# Patient Record
Sex: Female | Born: 1980 | Race: White | Hispanic: No | Marital: Married | Smoking: Never smoker
Health system: Southern US, Community
[De-identification: ages and names within clinical notes are randomized; demographics above are authoritative.]

## PROBLEM LIST (undated history)

## (undated) DIAGNOSIS — R87619 Unspecified abnormal cytological findings in specimens from cervix uteri: Secondary | ICD-10-CM

## (undated) DIAGNOSIS — O24419 Gestational diabetes mellitus in pregnancy, unspecified control: Secondary | ICD-10-CM

## (undated) HISTORY — PX: WISDOM TOOTH EXTRACTION: SHX21

---

## 2011-02-28 ENCOUNTER — Ambulatory Visit: Payer: Self-pay

## 2012-06-08 ENCOUNTER — Inpatient Hospital Stay: Payer: Self-pay | Admitting: Obstetrics and Gynecology

## 2012-06-08 LAB — HEMATOCRIT: HCT: 44.2 % (ref 35.0–47.0)

## 2012-06-09 LAB — HEMATOCRIT: HCT: 38.2 % (ref 35.0–47.0)

## 2012-07-28 ENCOUNTER — Ambulatory Visit: Payer: Self-pay | Admitting: Family Medicine

## 2012-07-28 LAB — GC/CHLAMYDIA PROBE AMP

## 2012-07-28 LAB — WET PREP, GENITAL

## 2014-02-06 DIAGNOSIS — R87619 Unspecified abnormal cytological findings in specimens from cervix uteri: Secondary | ICD-10-CM

## 2014-02-06 HISTORY — DX: Unspecified abnormal cytological findings in specimens from cervix uteri: R87.619

## 2014-03-09 NOTE — L&D Delivery Note (Signed)
Deliver Note   Date of Delivery:   10/11/2014 Primary OB:   [facility] Gestational Age/EDD: [redacted]w[redacted]d by 11/05/2014, by Last Menstrual Period  Antepartum complications:  OB History    Gravida Para Term Preterm AB TAB SAB Ectopic Multiple Living   Delivered By:   Vena Austria MD  Delivery Type:   SVD Anesthesia:     epidural  Intrapartum complications:  GBS:     Unknown declined GBS prophylaxis Laceration:    none Episiotomy:    none Placenta:    Spontaneous Estimated Blood Loss:  Baby:     Liveborn female  APGAR (1 MIN): 8   APGAR (5 MINS): 9   Weight  pending    Deliver Details   A female was delivered via  (Presentation: OA ).  APGAR: 8, 9; weight  .   Placenta status: spontaneous, intact.  Cord:  3VC without complications: Marland Kitchen    Mom to postpartum.  Baby to Couplet care / Skin to Skin.

## 2014-07-17 NOTE — H&P (Signed)
L&D Evaluation:  History:  HPI 34 year old G2 P1001 with EDC=06/10/2012 by LMP=09/03/2012 presents at 6939 5/7 weeks with c/o onset ctxs last night at 0030, intensifying at 1000 this AM. Has had a bloody show. No LOF. Has had a URI x 4 days, but is starting to feel better. No fever. Prenatal care remarkable for a early first trimester ultrasound confirming dates, , an elevated one hr GTT of 190 with  a normal 3hr GTT, a positive HRHPV on Pap smear, and negative GBS culture.   Presents with contractions   Patient's Medical History No Chronic Illness   Patient's Surgical History Wisdom teeth extraction   Medications Pre Natal Vitamins  fish oil capsules, vit D 400 IU, Ambien, Afrin spray.   Allergies NKDA   Social History none   Family History Non-Contributory   ROS:  ROS ROS positive for nasal congestion   Exam:  Vital Signs stable   General breathing thru contractions   Mental Status clear   Chest clear   Heart normal sinus rhythm, no murmur/gallop/rubs   Abdomen gravid, tender with contractions   Estimated Fetal Weight Average for gestational age, 7-10   Fetal Position cephalic   Edema no edema   Reflexes 2+   Pelvic no external lesions, 4/80-90%/-1   Mebranes Intact   FHT normal rate with no decels, 135 with accels to 160s   FHT Description Cat 1   Ucx regular, q2-3 min   Skin dry   Impression:  Impression IUP at 39 5/7 weeks  in active labor   Plan:  Plan EFM/NST, monitor contractions and for cervical change, Epidural if desires   Electronic Signatures: Trinna BalloonGutierrez, Dequon Schnebly L (CNM)  (Signed 02-Apr-14 13:54)  Authored: L&D Evaluation   Last Updated: 02-Apr-14 13:54 by Trinna BalloonGutierrez, Fiana Gladu L (CNM)

## 2014-10-11 ENCOUNTER — Inpatient Hospital Stay
Admission: EM | Admit: 2014-10-11 | Discharge: 2014-10-13 | DRG: 775 | Disposition: A | Discharge status: Home / Self Care | Payer: 59 | Attending: Obstetrics and Gynecology | Admitting: Obstetrics and Gynecology

## 2014-10-11 ENCOUNTER — Inpatient Hospital Stay: Payer: 59 | Admitting: Anesthesiology

## 2014-10-11 ENCOUNTER — Encounter: Payer: Self-pay | Admitting: Advanced Practice Midwife

## 2014-10-11 DIAGNOSIS — O42913 Preterm premature rupture of membranes, unspecified as to length of time between rupture and onset of labor, third trimester: Principal | ICD-10-CM | POA: Diagnosis present

## 2014-10-11 DIAGNOSIS — Z3A36 36 weeks gestation of pregnancy: Secondary | ICD-10-CM | POA: Diagnosis present

## 2014-10-11 DIAGNOSIS — O4290 Premature rupture of membranes, unspecified as to length of time between rupture and onset of labor, unspecified weeks of gestation: Secondary | ICD-10-CM | POA: Diagnosis present

## 2014-10-11 HISTORY — DX: Unspecified abnormal cytological findings in specimens from cervix uteri: R87.619

## 2014-10-11 LAB — ABO/RH: ABO/RH(D): O POS

## 2014-10-11 LAB — CBC
HCT: 40.1 % (ref 35.0–47.0)
HEMOGLOBIN: 13.9 g/dL (ref 12.0–16.0)
MCH: 31.5 pg (ref 26.0–34.0)
MCHC: 34.6 g/dL (ref 32.0–36.0)
MCV: 91.2 fL (ref 80.0–100.0)
Platelets: 179 10*3/uL (ref 150–440)
RBC: 4.4 MIL/uL (ref 3.80–5.20)
RDW: 12.7 % (ref 11.5–14.5)
WBC: 10.3 10*3/uL (ref 3.6–11.0)

## 2014-10-11 LAB — TYPE AND SCREEN
ABO/RH(D): O POS
Antibody Screen: NEGATIVE

## 2014-10-11 MED ORDER — FENTANYL 2.5 MCG/ML W/ROPIVACAINE 0.2% IN NS 100 ML EPIDURAL INFUSION (ARMC-ANES)
EPIDURAL | Status: AC
  Administered 2014-10-11: 11 mL/h via EPIDURAL
  Filled 2014-10-11: qty 100

## 2014-10-11 MED ORDER — ACETAMINOPHEN 325 MG PO TABS: 650.0000 mg | ORAL_TABLET | ORAL | Status: DC | PRN

## 2014-10-11 MED ORDER — LIDOCAINE-EPINEPHRINE (PF) 1.5 %-1:200000 IJ SOLN
INTRAMUSCULAR | Status: DC | PRN
  Administered 2014-10-11: 2 mL via EPIDURAL

## 2014-10-11 MED ORDER — TERBUTALINE SULFATE 1 MG/ML IJ SOLN: 0.2500 mg | Freq: Once | INTRAMUSCULAR | Status: AC | PRN

## 2014-10-11 MED ORDER — EPHEDRINE 5 MG/ML INJ
10.0000 mg | INTRAVENOUS | Status: DC | PRN
  Filled 2014-10-11: qty 2

## 2014-10-11 MED ORDER — FENTANYL 2.5 MCG/ML W/ROPIVACAINE 0.2% IN NS 100 ML EPIDURAL INFUSION (ARMC-ANES): 11.0000 mL/h | EPIDURAL | Status: DC

## 2014-10-11 MED ORDER — HYDROCODONE-ACETAMINOPHEN 5-325 MG PO TABS: 1.0000 | ORAL_TABLET | ORAL | Status: DC | PRN

## 2014-10-11 MED ORDER — BUPIVACAINE HCL (PF) 0.25 % IJ SOLN
INTRAMUSCULAR | Status: DC | PRN
  Administered 2014-10-11: 5 mL via PERINEURAL

## 2014-10-11 MED ORDER — OXYTOCIN BOLUS FROM INFUSION: 500.0000 mL | INTRAVENOUS | Status: DC

## 2014-10-11 MED ORDER — OXYTOCIN 40 UNITS IN LACTATED RINGERS INFUSION - SIMPLE MED
1.0000 m[IU]/min | INTRAVENOUS | Status: DC
  Administered 2014-10-11: 1 m[IU]/min via INTRAVENOUS

## 2014-10-11 MED ORDER — PHENYLEPHRINE 40 MCG/ML (10ML) SYRINGE FOR IV PUSH (FOR BLOOD PRESSURE SUPPORT)
80.0000 ug | PREFILLED_SYRINGE | INTRAVENOUS | Status: DC | PRN
Start: 2014-10-11 — End: 2014-10-12
  Filled 2014-10-11: qty 2

## 2014-10-11 MED ORDER — LACTATED RINGERS IV SOLN
INTRAVENOUS | Status: DC
  Administered 2014-10-11: 12:00:00 via INTRAVENOUS

## 2014-10-11 MED ORDER — DIPHENHYDRAMINE HCL 50 MG/ML IJ SOLN: 12.5000 mg | INTRAMUSCULAR | Status: DC | PRN

## 2014-10-11 MED ORDER — BUTORPHANOL TARTRATE 1 MG/ML IJ SOLN: 2.0000 mg | INTRAMUSCULAR | Status: DC | PRN

## 2014-10-11 MED ORDER — OXYTOCIN 40 UNITS IN LACTATED RINGERS INFUSION - SIMPLE MED: 62.5000 mL/h | INTRAVENOUS | Status: DC

## 2014-10-11 MED ORDER — OXYTOCIN 40 UNITS IN LACTATED RINGERS INFUSION - SIMPLE MED
INTRAVENOUS | Status: AC
  Administered 2014-10-11: 1 m[IU]/min via INTRAVENOUS
  Filled 2014-10-11: qty 1000

## 2014-10-11 MED ORDER — LACTATED RINGERS IV SOLN: 500.0000 mL | INTRAVENOUS | Status: DC | PRN

## 2014-10-11 NOTE — Progress Notes (Signed)
Subjective:  Increasing intensity of contractions, still leaking clear fluid  Objective:   Vitals: Blood pressure 111/76, pulse 74, temperature 98 F (36.7 C), temperature source Oral, resp. rate 18, last menstrual period 01/29/2014. General:  Abdomen: Cervical Exam:  Dilation: 1.5 Effacement (%): 80 Station: -2 Presentation: Vertex Exam by:: Tammy Brothers   FHT: 135, mod, +accels, no decels Toco: q3-91min  Results for orders placed or performed during the hospital encounter of 10/11/14 (from the past 24 hour(s))  CBC     Status: None   Collection Time: 10/11/14 12:12 PM  Result Value Ref Range   WBC 10.3 3.6 - 11.0 K/uL   RBC 4.40 3.80 - 5.20 MIL/uL   Hemoglobin 13.9 12.0 - 16.0 g/dL   HCT 95.6 21.3 - 08.6 %   MCV 91.2 80.0 - 100.0 fL   MCH 31.5 26.0 - 34.0 pg   MCHC 34.6 32.0 - 36.0 g/dL   RDW 57.8 46.9 - 62.9 %   Platelets 179 150 - 440 K/uL  Type and screen     Status: None   Collection Time: 10/11/14 12:17 PM  Result Value Ref Range   ABO/RH(D) O POS    Antibody Screen NEG    Sample Expiration 10/14/2014     Assessment:   34 y.o. G3P2002 [redacted]w[redacted]d PROM, undergoing pitocin augmentation  Plan:   1) Labor - continue to titrate up on pitocin  2) Fetus - cat I tracing

## 2014-10-11 NOTE — Progress Notes (Addendum)
S: Pt has been ambulating in hallways off and on, no significant change in contractions  O: Cervix 1/80/-2, VSS, category 1 tracing, irregular ctx per toco  A: IUP at [redacted]w[redacted]d, PPROM  P: Mutual decision made to start Pitocin in about 1 hour. Reviewed risks of tachysystole or FITL with Pitocin. Pt desires to ambulate one more time.   GBS unknown: After discussion with transition nurse, pt opts to decline antibiotics (reports Family Hx of severe allergies). Nursery has discussed monitoring and implications for baby if GBS untreated.   Pain management: discussed pros and cons and side effects of IV stadol and epidural.   Addendum Will see if abel to send rapid GBS via PCR given patient has elected to against 2010 CDC GBS ppx guidlines

## 2014-10-11 NOTE — Anesthesia Procedure Notes (Signed)
Epidural Patient location during procedure: OB Start time: 10/11/2014 7:43 PM End time: 10/11/2014 8:07 PM  Staffing Anesthesiologist: Elijio Miles F Performed by: anesthesiologist   Preanesthetic Checklist Completed: patient identified, site marked, surgical consent, pre-op evaluation, timeout performed, IV checked, risks and benefits discussed and monitors and equipment checked  Epidural Patient position: sitting Prep: Betadine Patient monitoring: heart rate and continuous pulse ox Approach: midline Location: L3-L4 Injection technique: LOR air and hanging drop  Needle:  Needle type: Tuohy  Needle gauge: 18 G Needle length: 9 cm Needle insertion depth: 3.5 cm Catheter size: 20 Guage Catheter at skin depth: 8.5 cm Test dose: negative and 2% lidocaine with Epi 1:200 K  Assessment Sensory level: T8

## 2014-10-11 NOTE — Anesthesia Preprocedure Evaluation (Signed)
Anesthesia Evaluation  Patient identified by MRN, date of birth, ID band Patient awake    Reviewed: Allergy & Precautions, NPO status , Patient's Chart, lab work & pertinent test results  Airway Mallampati: II       Dental   Pulmonary neg pulmonary ROS,    Pulmonary exam normal       Cardiovascular negative cardio ROS Normal cardiovascular exam    Neuro/Psych negative neurological ROS     GI/Hepatic negative GI ROS, Neg liver ROS,   Endo/Other  negative endocrine ROS  Renal/GU negative Renal ROS     Musculoskeletal negative musculoskeletal ROS (+)   Abdominal Normal abdominal exam  (+)   Peds negative pediatric ROS (+)  Hematology negative hematology ROS (+)   Anesthesia Other Findings   Reproductive/Obstetrics negative OB ROS                             Anesthesia Physical Anesthesia Plan  ASA: II  Anesthesia Plan: Epidural   Post-op Pain Management:    Induction:   Airway Management Planned:   Additional Equipment:   Intra-op Plan:   Post-operative Plan:   Informed Consent: I have reviewed the patients History and Physical, chart, labs and discussed the procedure including the risks, benefits and alternatives for the proposed anesthesia with the patient or authorized representative who has indicated his/her understanding and acceptance.     Plan Discussed with:   Anesthesia Plan Comments:         Anesthesia Quick Evaluation

## 2014-10-11 NOTE — H&P (Signed)
Obstetric History and Physical  Chelsea Sherman is a 34 y.o. G3P1000 with Estimated Date of Delivery:11/05/14 per LMP at 7 wk [place] who presents at [redacted]w[redacted]d presenting for SROM at 0545 this am with several gushes of fluid since then. Patient states she has been having mild contractions, pink vaginal bleeding, , with active fetal movement.    Prenatal Course Source of Care: [facility]  with onset of care at 7 weeks Pregnancy complications or risks: none, early entry to care   She plans to breastfeed  Prenatal labs and studies: ABO, Rh: O+  Antibody: negative Rubella: Immune Varicella: Immune RPR:  Non-reactive HBsAg:  Neg HIV: Negative GC/CT: Neg/neg GBS: unknown 1 hr Glucola: 106   Genetic screening: negative 1st trimester screen TDAP: declined    Prenatal Transfer Tool   Past Medical History  Diagnosis Date  . Pap smear abnormality of cervix 02/2014    ASCUS    Past Surgical History  Procedure Laterality Date  . Wisdom tooth extraction      OB History  Gravida Para Term Preterm AB SAB TAB Ectopic Multiple Living  # Outcome Date GA Lbr Len/2nd Weight Sex Delivery Anes PTL Lv  3 Current           2 Term 06/08/12 [redacted]w[redacted]d    Vaginal - spontaneous     1 Gravida 02/07/10 [redacted]w[redacted]d    Vag-Spont         History   Social History  . Marital Status: Married    Spouse Name: N/A  . Number of Children: N/A  . Years of Education: N/A   Social History Main Topics  . Smoking status: Never Smoker   . Smokeless tobacco: Not on file  . Alcohol Use: No  . Drug Use: No  . Sexual Activity: Yes   Other Topics Concern  . None   Social History Narrative  . None    History reviewed. No pertinent family history.  Prescriptions prior to admission  Medication Sig Dispense Refill Last Dose  . cholecalciferol (VITAMIN D) 1000 UNITS tablet Take 1,000 Units by mouth daily.     . Lactobacillus (PROBIOTIC ACIDOPHILUS PO) Take 1 tablet by mouth daily.     . Omega-3 Fatty  Acids (FISH OIL BURP-LESS PO) Take by mouth.     . Prenatal Vit-Fe Fumarate-FA (MULTIVITAMIN-PRENATAL) 27-0.8 MG TABS tablet Take 1 tablet by mouth daily at 12 noon.       No Known Allergies  Review of Systems: Negative except for what is mentioned in HPI.  Physical Exam: LMP 01/29/2014 GENERAL: Well-developed, well-nourished female in no acute distress.  LUNGS: Clear to auscultation bilaterally.  HEART: Regular rate and rhythm. ABDOMEN: Soft, nontender, nondistended, gravid. EXTREMITIES: Nontender, no edema Cervical Exam: Dilatation 1 cm   Effacement 60 %   Station -2, + pooling, + fern, + nitrizine   Presentation: cephalic FHT: Category: 1 Baseline rate 130 bpm   Variability moderate  Accelerations present   Decelerations none Contractions: Every 4-8 mins   Pertinent Labs/Studies:   No results found for this or any previous visit (from the past 24 hour(s)).  Assessment : IUP at [redacted]w[redacted]d with PROM, early labor  Plan: Admission for PROM - monitor for cervical dilation, ambulation with intermittent montoring if desired Stadol or epidural if desired GBS Unknown: will get swab today. Nursery/Neo to discuss antibiotics with pt as she initially declined prophylaxis.

## 2014-10-11 NOTE — Progress Notes (Signed)
Received to LD for c/o SROM at 0520 this am

## 2014-10-12 LAB — CBC
HEMATOCRIT: 38.4 % (ref 35.0–47.0)
Hemoglobin: 13.2 g/dL (ref 12.0–16.0)
MCH: 31.5 pg (ref 26.0–34.0)
MCHC: 34.3 g/dL (ref 32.0–36.0)
MCV: 91.8 fL (ref 80.0–100.0)
Platelets: 182 10*3/uL (ref 150–440)
RBC: 4.18 MIL/uL (ref 3.80–5.20)
RDW: 12.5 % (ref 11.5–14.5)
WBC: 15.8 10*3/uL — AB (ref 3.6–11.0)

## 2014-10-12 LAB — RPR: RPR Ser Ql: NONREACTIVE

## 2014-10-12 MED ORDER — ACETAMINOPHEN 325 MG PO TABS: 650.0000 mg | ORAL_TABLET | ORAL | Status: DC | PRN

## 2014-10-12 MED ORDER — DIBUCAINE 1 % RE OINT
1.0000 | TOPICAL_OINTMENT | RECTAL | Status: DC | PRN
Start: 2014-10-12 — End: 2014-10-12

## 2014-10-12 MED ORDER — ZOLPIDEM TARTRATE 5 MG PO TABS: 5.0000 mg | ORAL_TABLET | Freq: Every evening | ORAL | Status: DC | PRN

## 2014-10-12 MED ORDER — SIMETHICONE 80 MG PO CHEW: 80.0000 mg | CHEWABLE_TABLET | ORAL | Status: DC | PRN

## 2014-10-12 MED ORDER — DIPHENHYDRAMINE HCL 25 MG PO CAPS: 25.0000 mg | ORAL_CAPSULE | Freq: Four times a day (QID) | ORAL | Status: DC | PRN

## 2014-10-12 MED ORDER — SENNOSIDES-DOCUSATE SODIUM 8.6-50 MG PO TABS: 2.0000 | ORAL_TABLET | ORAL | Status: DC

## 2014-10-12 MED ORDER — ONDANSETRON HCL 4 MG/2ML IJ SOLN
4.0000 mg | INTRAMUSCULAR | Status: DC | PRN
Start: 2014-10-12 — End: 2014-10-13

## 2014-10-12 MED ORDER — ONDANSETRON HCL 4 MG PO TABS: 4.0000 mg | ORAL_TABLET | ORAL | Status: DC | PRN

## 2014-10-12 MED ORDER — WITCH HAZEL-GLYCERIN EX PADS: 1.0000 "application " | MEDICATED_PAD | CUTANEOUS | Status: DC | PRN

## 2014-10-12 MED ORDER — LANOLIN HYDROUS EX OINT: TOPICAL_OINTMENT | CUTANEOUS | Status: DC | PRN

## 2014-10-12 MED ORDER — DIBUCAINE 1 % RE OINT: 1.0000 "application " | TOPICAL_OINTMENT | RECTAL | Status: DC | PRN

## 2014-10-12 MED ORDER — IBUPROFEN 600 MG PO TABS
600.0000 mg | ORAL_TABLET | Freq: Four times a day (QID) | ORAL | Status: DC
  Administered 2014-10-12 – 2014-10-13 (×5): 600 mg via ORAL
  Filled 2014-10-12 (×5): qty 1

## 2014-10-12 MED ORDER — BENZOCAINE-MENTHOL 20-0.5 % EX AERO: 1.0000 "application " | INHALATION_SPRAY | CUTANEOUS | Status: DC | PRN

## 2014-10-12 MED ORDER — PRENATAL MULTIVITAMIN CH: 1.0000 | ORAL_TABLET | Freq: Every day | ORAL | Status: DC

## 2014-10-12 MED ORDER — ONDANSETRON HCL 4 MG/2ML IJ SOLN: 4.0000 mg | INTRAMUSCULAR | Status: DC | PRN

## 2014-10-12 MED ORDER — IBUPROFEN 600 MG PO TABS: 600.0000 mg | ORAL_TABLET | Freq: Four times a day (QID) | ORAL | Status: DC

## 2014-10-12 MED ORDER — PRENATAL MULTIVITAMIN CH
1.0000 | ORAL_TABLET | Freq: Every day | ORAL | Status: DC
  Administered 2014-10-12 – 2014-10-13 (×2): 1 via ORAL
  Filled 2014-10-12 (×2): qty 1

## 2014-10-12 MED ORDER — BENZOCAINE-MENTHOL 20-0.5 % EX AERO
1.0000 "application " | INHALATION_SPRAY | CUTANEOUS | Status: DC | PRN
  Filled 2014-10-12: qty 56

## 2014-10-12 NOTE — Progress Notes (Signed)
Post Partum Day 1 Subjective: no complaints, ambulating without difficulty. Has been voiding qs. +BM  Objective: Blood pressure 108/63, pulse 83, temperature 98.1 F (36.7 C), temperature source Oral, resp. rate 18, height  (1.702 m), weight 148 lb (67.132 kg), last menstrual period 01/29/2014, SpO2 98 %, unknown if currently breastfeeding.  Physical Exam:  General: alert Lochia: appropriate Uterine Fundus: firm at U-2/ML/NT DVT Evaluation: No evidence of DVT seen on physical exam.   Recent Labs  10/11/14 1212 10/12/14 0439  HGB 13.9 13.2  HCT 40.1 38.4  WBC 10.3 15.8*  PLT 179 182    Assessment/Plan: Plan for discharge tomorrow  Breastfeeding O POS/ RI/ VI   LOS: 1 day   Chelsea Sherman 10/12/2014, 2:29 PM

## 2014-10-12 NOTE — Anesthesia Postprocedure Evaluation (Signed)
  Anesthesia Post-op Note  Patient: Chelsea Sherman  Procedure(s) Performed: * No procedures listed *  Anesthesia type:Epidural  Patient location: PACU  Post pain: Pain level controlled  Post assessment: Post-op Vital signs reviewed, Patient's Cardiovascular Status Stable, Respiratory Function Stable, Patent Airway and No signs of Nausea or vomiting  Post vital signs: Reviewed and stable  Last Vitals:  Filed Vitals:   10/12/14 0516  BP: 98/53  Pulse: 68  Temp: 36.7 C  Resp: 18    Level of consciousness: awake, alert  and patient cooperative  Complications: No apparent anesthesia complications

## 2014-10-13 LAB — CULTURE, BETA STREP (GROUP B ONLY)

## 2014-10-13 MED ORDER — IBUPROFEN 600 MG PO TABS: 600.0000 mg | ORAL_TABLET | Freq: Four times a day (QID) | ORAL | Status: DC

## 2014-10-13 MED ORDER — WHITE PETROLATUM GEL
Status: AC
  Filled 2014-10-13: qty 10

## 2014-10-13 NOTE — Progress Notes (Signed)
Pt discharged home with infant.  Discharge instructions and follow up appointment given to and reviewed with pt.  Pt verbalized understanding.  Escorted by auxillary. 

## 2014-10-13 NOTE — Discharge Summary (Signed)
Obstetric Discharge Summary Reason for Admission: rupture of membranes Delivery Type: spontaneous vaginal delivery Postpartum Procedures: none Complications-Intrapartum or Postpartum: none HEMOGLOBIN  Date Value Ref Range Status  10/12/2014 13.2 12.0 - 16.0 g/dL Final   HCT  Date Value Ref Range Status  10/12/2014 38.4 35.0 - 47.0 % Final  06/09/2012 38.2 35.0-47.0 % Final  Admission Hct: 40.1  Gestational Age at Delivery: [redacted]w[redacted]d  Antepartum complications: none Date of Delivery: 10/11/14  Delivered By: Dr Bonney Aid Delivery Type: spontaneous vaginal delivery  Physical Exam:  General: alert Lochia: appropriate Uterine Fundus: firm Incision: N/A DVT Evaluation: No evidence of DVT seen on physical exam. Abdomen: abdomen is soft without significant tenderness, masses, organomegaly or guarding  Blood Type: O+ Rubella: Immune Varicella: Immune TDAP: declined by patient Contraception: condoms  Discharge Diagnoses: Term Pregnancy-delivered  Discharge Information: Date: 10/13/2014 Activity: pelvic rest Diet: routine Medications: PNV, Ibuprofen and probiotic, Vitamin D, and Fish oil Condition: stable Instructions:  Discharge instructions:   Call office if you have any of the following: headache, visual changes, fever >100 F, chills, breast concerns, excessive vaginal bleeding, incision drainage or problems, leg pain or redness, depression or any other concerns.   Activity: Do not lift > 10 lbs for 6 weeks.  No intercourse or tampons for 6 weeks.  No driving for 1-2 weeks.   Discharge to: home Follow-up Information    Follow up with Lorrene Reid, MD. Schedule an appointment as soon as possible for a visit in 6 weeks.   Specialty:  Obstetrics and Gynecology   Why:  postpartum check   Contact information:   [address] [place] [address] [phone]       Newborn Data: Live born female  Birth Weight:   APGAR: 8, 9  Home with mother.  Marta Antu, [place] 10/13/2014, 11:40 AM

## 2014-10-13 NOTE — Discharge Instructions (Signed)
Discharge instructions:  ° °Call office if you have any of the following: headache, visual changes, fever >100 F, chills, breast concerns, excessive vaginal bleeding, incision drainage or problems, leg pain or redness, depression or any other concerns.  ° °Activity: Do not lift > 10 lbs for 6 weeks.  °No intercourse or tampons for 6 weeks.  °No driving for 1-2 weeks.  ° °Vaginal Delivery, Care After °Refer to this sheet in the next few weeks. These discharge instructions provide you with information on caring for yourself after delivery. Your caregiver may also give you specific instructions. Your treatment has been planned according to the most current medical practices available, but problems sometimes occur. Call your caregiver if you have any problems or questions after you go home. °HOME CARE INSTRUCTIONS °· Take over-the-counter or prescription medicines only as directed by your caregiver or pharmacist. °· Do not drink alcohol, especially if you are breastfeeding or taking medicine to relieve pain. °· Do not chew or smoke tobacco. °· Do not use illegal drugs. °· Continue to use good perineal care. Good perineal care includes: °¨ Wiping your perineum from front to back. °¨ Keeping your perineum clean. °· Do not use tampons or douche until your caregiver says it is okay. °· Shower, wash your hair, and take tub baths as directed by your caregiver. °· Wear a well-fitting bra that provides breast support. °· Eat healthy foods. °· Drink enough fluids to keep your urine clear or pale yellow. °· Eat high-fiber foods such as whole grain cereals and breads, brown rice, beans, and fresh fruits and vegetables every day. These foods may help prevent or relieve constipation. °· Follow your caregiver's recommendations regarding resumption of activities such as climbing stairs, driving, lifting, exercising, or traveling. °· Talk to your caregiver about resuming sexual activities. Resumption of sexual activities is dependent  upon your risk of infection, your rate of healing, and your comfort and desire to resume sexual activity. °· Try to have someone help you with your household activities and your newborn for at least a few days after you leave the hospital. °· Rest as much as possible. Try to rest or take a nap when your newborn is sleeping. °· Increase your activities gradually. °· Keep all of your scheduled postpartum appointments. It is very important to keep your scheduled follow-up appointments. At these appointments, your caregiver will be checking to make sure that you are healing physically and emotionally. °SEEK MEDICAL CARE IF:  °· You are passing large clots from your vagina. Save any clots to show your caregiver. °· You have a foul smelling discharge from your vagina. °· You have trouble urinating. °· You are urinating frequently. °· You have pain when you urinate. °· You have a change in your bowel movements. °· You have increasing redness, pain, or swelling near your vaginal incision (episiotomy) or vaginal tear. °· You have pus draining from your episiotomy or vaginal tear. °· Your episiotomy or vaginal tear is separating. °· You have painful, hard, or reddened breasts. °· You have a severe headache. °· You have blurred vision or see spots. °· You feel sad or depressed. °· You have thoughts of hurting yourself or your newborn. °· You have questions about your care, the care of your newborn, or medicines. °· You are dizzy or light-headed. °· You have a rash. °· You have nausea or vomiting. °· You were breastfeeding and have not had a menstrual period within 12 weeks after you stopped breastfeeding. °· You   are not breastfeeding and have not had a menstrual period by the 12th week after delivery.  You have a fever. SEEK IMMEDIATE MEDICAL CARE IF:   You have persistent pain.  You have chest pain.  You have shortness of breath.  You faint.  You have leg pain.  You have stomach pain.  Your vaginal bleeding  saturates two or more sanitary pads in 1 hour. MAKE SURE YOU:   Understand these instructions.  Will watch your condition.  Will get help right away if you are not doing well or get worse. Document Released: 02/21/2000 Document Revised: 07/10/2013 Document Reviewed: 10/21/2011 [facility] Patient Information 2015 [employer], [place]. This information is not intended to replace advice given to you by your health care provider. Make sure you discuss any questions you have with your health care provider.

## 2018-02-08 ENCOUNTER — Encounter: Payer: Self-pay | Admitting: Obstetrics and Gynecology

## 2018-02-08 ENCOUNTER — Ambulatory Visit (INDEPENDENT_AMBULATORY_CARE_PROVIDER_SITE_OTHER): Payer: 59 | Admitting: Obstetrics and Gynecology

## 2018-02-08 ENCOUNTER — Other Ambulatory Visit (HOSPITAL_COMMUNITY)
Admission: RE | Admit: 2018-02-08 | Discharge: 2018-02-08 | Disposition: A | Discharge status: Home / Self Care | Payer: 59 | Source: Ambulatory Visit | Attending: Obstetrics and Gynecology | Admitting: Obstetrics and Gynecology

## 2018-02-08 VITALS — BP 120/68 | HR 110 | Ht 67.0 in | Wt 133.0 lb

## 2018-02-08 DIAGNOSIS — Z01419 Encounter for gynecological examination (general) (routine) without abnormal findings: Secondary | ICD-10-CM | POA: Diagnosis not present

## 2018-02-08 DIAGNOSIS — Z124 Encounter for screening for malignant neoplasm of cervix: Secondary | ICD-10-CM

## 2018-02-08 DIAGNOSIS — Z1239 Encounter for other screening for malignant neoplasm of breast: Secondary | ICD-10-CM

## 2018-02-08 NOTE — Progress Notes (Signed)
Gynecology Annual Exam   PCP: Rolm Gala, MD  Chief Complaint:  Chief Complaint  Patient presents with  . Gynecologic Exam    History of Present Illness: [name] a 37 y.o. [id number] presents for annual exam. The patient has no complaints today.   LMP: Patient's last menstrual period was 01/16/2018. Average Interval: regular, 25 days Duration of flow: 3 days Heavy Menses: no Clots: no Intermenstrual Bleeding: no Postcoital Bleeding: no Dysmenorrhea: no Patient still currently breast feeding.  The patient is sexually active. She currently uses condoms for contraception. She denies dyspareunia.  The patient does perform self breast exams.  There is no notable family history of breast or ovarian cancer in her family.  The patient wears seatbelts: yes.   The patient has regular exercise: not asked.    The patient denies current symptoms of depression.    Review of Systems: Review of Systems  Constitutional: Negative for chills and fever.  HENT: Negative for congestion.   Respiratory: Negative for cough and shortness of breath.   Cardiovascular: Negative for chest pain and palpitations.  Gastrointestinal: Negative for abdominal pain, constipation, diarrhea, heartburn, nausea and vomiting.  Genitourinary: Negative for dysuria, frequency and urgency.  Skin: Negative for itching and rash.  Neurological: Negative for dizziness and headaches.  Endo/Heme/Allergies: Negative for polydipsia.  Psychiatric/Behavioral: Negative for depression.    Past Medical History:  Past Medical History:  Diagnosis Date  . Pap smear abnormality of cervix 02/2014   ASCUS    Past Surgical History:  Past Surgical History:  Procedure Laterality Date  . WISDOM TOOTH EXTRACTION      Gynecologic History:  Patient's last menstrual period was 01/16/2018. Contraception: none Last Pap: Results were:11/20/2014 ASCUS with POSITIVE high risk HPV   Obstetric History: [id number]  Family  History:  Family History  Problem Relation Age of Onset  . Diabetes Mother   . Heart disease Maternal Grandfather   . Cancer Paternal Grandmother   . Heart disease Paternal Grandfather     Social History:  Social History   Socioeconomic History  . Marital status: Married    Spouse name: Not on file  . Number of children: Not on file  . Years of education: Not on file  . Highest education level: Not on file  Occupational History  . Not on file  Social Needs  . Financial resource strain: Not on file  . Food insecurity:    Worry: Not on file    Inability: Not on file  . Transportation needs:    Medical: Not on file    Non-medical: Not on file  Tobacco Use  . Smoking status: Never Smoker  . Smokeless tobacco: Never Used  Substance and Sexual Activity  . Alcohol use: No  . Drug use: No  . Sexual activity: Yes    Birth control/protection: Condom  Lifestyle  . Physical activity:    Days per week: Not on file    Minutes per session: Not on file  . Stress: Not on file  Relationships  . Social connections:    Talks on phone: Not on file    Gets together: Not on file    Attends religious service: Not on file    Active member of club or organization: Not on file    Attends meetings of clubs or organizations: Not on file    Relationship status: Not on file  . Intimate partner violence:    Fear of current or ex partner: Not on  file    Emotionally abused: Not on file    Physically abused: Not on file    Forced sexual activity: Not on file  Other Topics Concern  . Not on file  Social History Narrative  . Not on file    Allergies:  No Known Allergies  Medications: Prior to Admission medications   Medication Sig Start Date End Date Taking? Authorizing Provider  cholecalciferol (VITAMIN D) 1000 UNITS tablet Take 1,000 Units by mouth daily.   Yes [provider]  ibuprofen (ADVIL,MOTRIN) 600 MG tablet Take 1 tablet (600 mg total) by mouth every 6 (six) hours.  10/13/14  Yes Brothers, Tamara, CNM  Lactobacillus (PROBIOTIC ACIDOPHILUS PO) Take 1 tablet by mouth daily.   Yes [provider]  Omega-3 Fatty Acids (FISH OIL BURP-LESS PO) Take by mouth.   Yes [provider]  Prenatal Vit-Fe Fumarate-FA (MULTIVITAMIN-PRENATAL) 27-0.8 MG TABS tablet Take 1 tablet by mouth daily at 12 noon.   Yes [provider]    Physical Exam Vitals: Blood pressure 120/68, pulse (!) 110, height 5\' 7"  (1.702 m), weight 133 lb (60.3 kg), last menstrual period 01/16/2018, not currently breastfeeding.  General: NAD HEENT: normocephalic, anicteric Thyroid: no enlargement, no palpable nodules Pulmonary: No increased work of breathing, CTAB Cardiovascular: RRR, distal pulses 2+ Breast: Breast symmetrical, no tenderness, no palpable nodules or masses, no skin or nipple retraction present, no nipple discharge.  No axillary or supraclavicular lymphadenopathy. Abdomen: NABS, soft, non-tender, non-distended.  Umbilicus without lesions.  No hepatomegaly, splenomegaly or masses palpable. No evidence of hernia  Genitourinary:  External: Normal external female genitalia.  Normal urethral meatus, normal Bartholin's and Skene's glands.    Vagina: Normal vaginal mucosa, no evidence of prolapse.    Cervix: Grossly normal in appearance, no bleeding  Uterus: Non-enlarged, mobile, normal contour.  No CMT  Adnexa: ovaries non-enlarged, no adnexal masses  Rectal: deferred  Lymphatic: no evidence of inguinal lymphadenopathy Extremities: no edema, erythema, or tenderness Neurologic: Grossly intact Psychiatric: mood appropriate, affect full  Female chaperone present for pelvic and breast  portions of the physical exam    Assessment: 37 y.o. [id number] routine annual exam  Plan: Problem List Items Addressed This Visit    None    Visit Diagnoses    Encounter for gynecological examination without abnormal finding    -  Primary   Screening for malignant neoplasm of  cervix       Relevant Orders   Cytology - PAP   Breast screening          2) STI screening  was notoffered and therefore not obtained  2)  ASCCP guidelines and rational discussed. Obtained given prior abnormal  3) Contraception - the patient is currently using  none.  She is attempting to conceive in the near future  4) Routine healthcare maintenance including cholesterol, diabetes screening discussed managed by PCP  5) Return in about 1 year (around 02/09/2019) for annual.   Vena AustriaAndreas Silvia Hightower, MD, Merlinda FrederickFACOG [facility], [facility] 02/08/2018, 3:36 PM

## 2018-02-11 LAB — CYTOLOGY - PAP
Adequacy: ABSENT
Diagnosis: NEGATIVE
HPV (WINDOPATH): NOT DETECTED

## 2018-03-09 NOTE — L&D Delivery Note (Addendum)
Obstetrical Delivery Note   Date of Delivery:   11/19/2018 Primary OB:   [facility] Gestational Age/EDD: [redacted]w[redacted]d (Dated by LMP) Antepartum complications: Gestational diabetes controlled on diet  Delivered By:   Dalia Heading, CNM  Delivery Type:   spontaneous vaginal delivery  Procedure Details:   Mother pushed to deliver a vigorous female infant in [place]. Baby dried and placed on mother's abdomen. After a delayed cord clamping the cord was cut by the father of the baby. Apgars 8/9. Bay then placed skin to skin with mother on mother's chest. Spontaneous delivery of intact placenta and 3 vessel cord. There was a marginal cord insertion. Repaired first degree laceration at the fossa navicularis with 3-0 Chromic. Also repaired the laceration of the right labia minora/ peri Anesthesia:    local and topical Intrapartum complications: None GBS:    negative Laceration:    First degree laceration of fossa navicularis and right labia/periurethral Episiotomy:    none Placenta:    Via active 3rd stage. To pathology: no Estimated Blood Loss:  300 ml Baby:    Liveborn female, Apgars 8/9, weight pending    Dalia Heading, CNM  Attestation of Attending Supervision of Advanced Practitioner (CNM):  Evaluation and management procedures were performed by the Advanced Practitioner under my supervision and collaboration.  Although I was not present for the delivery itself,  I have reviewed the Advanced Practitioner's note and chart, and I agree with the management and plan.  Barnett Applebaum, MD, [facility], [facility] 11/20/2018  9:07 AM

## 2018-03-30 ENCOUNTER — Encounter: Payer: Self-pay | Admitting: Obstetrics and Gynecology

## 2018-03-30 ENCOUNTER — Other Ambulatory Visit (HOSPITAL_COMMUNITY)
Admission: RE | Admit: 2018-03-30 | Discharge: 2018-03-30 | Disposition: A | Discharge status: Home / Self Care | Payer: 59 | Source: Ambulatory Visit | Attending: Obstetrics and Gynecology | Admitting: Obstetrics and Gynecology

## 2018-03-30 ENCOUNTER — Ambulatory Visit (INDEPENDENT_AMBULATORY_CARE_PROVIDER_SITE_OTHER): Payer: 59 | Admitting: Obstetrics and Gynecology

## 2018-03-30 VITALS — BP 116/65 | Wt 131.0 lb

## 2018-03-30 DIAGNOSIS — Z3201 Encounter for pregnancy test, result positive: Secondary | ICD-10-CM | POA: Diagnosis not present

## 2018-03-30 DIAGNOSIS — Z3A01 Less than 8 weeks gestation of pregnancy: Secondary | ICD-10-CM | POA: Diagnosis present

## 2018-03-30 DIAGNOSIS — Z113 Encounter for screening for infections with a predominantly sexual mode of transmission: Secondary | ICD-10-CM | POA: Diagnosis present

## 2018-03-30 DIAGNOSIS — O09521 Supervision of elderly multigravida, first trimester: Secondary | ICD-10-CM

## 2018-03-30 DIAGNOSIS — O0990 Supervision of high risk pregnancy, unspecified, unspecified trimester: Secondary | ICD-10-CM | POA: Insufficient documentation

## 2018-03-30 DIAGNOSIS — O09529 Supervision of elderly multigravida, unspecified trimester: Secondary | ICD-10-CM | POA: Insufficient documentation

## 2018-03-30 DIAGNOSIS — Z3689 Encounter for other specified antenatal screening: Secondary | ICD-10-CM

## 2018-03-30 DIAGNOSIS — N912 Amenorrhea, unspecified: Secondary | ICD-10-CM

## 2018-03-30 DIAGNOSIS — O09291 Supervision of pregnancy with other poor reproductive or obstetric history, first trimester: Secondary | ICD-10-CM

## 2018-03-30 LAB — POCT URINALYSIS DIPSTICK OB
Glucose, UA: NEGATIVE
POC,PROTEIN,UA: NEGATIVE

## 2018-03-30 LAB — POCT URINE PREGNANCY: Preg Test, Ur: POSITIVE — AB

## 2018-03-30 LAB — OB RESULTS CONSOLE VARICELLA ZOSTER ANTIBODY, IGG: Varicella: IMMUNE

## 2018-03-30 LAB — OB RESULTS CONSOLE GC/CHLAMYDIA: Gonorrhea: NEGATIVE

## 2018-03-30 NOTE — Progress Notes (Signed)
NOB 

## 2018-03-30 NOTE — Progress Notes (Signed)
New Obstetric Patient H&P    Chief Complaint: "Desires prenatal care"   History of Present Illness: [name] a 38 y.o. [phone] Not Hispanic or Latino female, presents with amenorrhea and positive home pregnancy test. Patient's last menstrual period was 02/13/2018 (exact date). and based on her  LMP, her EDD is Estimated Date of Delivery: 11/20/18 and her EGA is 838w3d. Her last pap smear was 02/08/2018 and was NIL HPV negative.    Her last menstrual period was normal. Since her LMP she claims she has experienced minimal fatigue and breast tenderness. She denies vaginal bleeding. Her past medical history is noncontributory. Her prior pregnancies are notable for preterm prelabor rupture of membranes at 41338w3d with G3 pregnancy  Since her LMP, she admits to the use of tobacco products  no There are cats in the home in the home  no She admits close contact with children on a regular basis  yes  She has had chicken pox in the past yes She has had Tuberculosis exposures, symptoms, or previously tested positive for TB   no Current or past history of domestic violence. no  Genetic Screening/Teratology Counseling: (Includes patient, baby's father, or anyone in either family with:)   1. [facility]  yes 2. Thalassemia ([place], [place], Mediterranean, or Asian background): MCV<80  no 3. Neural tube defect (meningomyelocele, spina bifida, anencephaly)  no 4. Congenital heart defect  no  5. Down syndrome  no 6. Tay-Sachs (Jewish, [place])  no 7. Canavan's Disease  no 8. Sickle cell disease or trait (African)  no  9. Hemophilia or other blood disorders  no  10. Muscular dystrophy  no  11. Cystic fibrosis  no  12. Huntington's Chorea  no  13. Mental retardation/autism  no 14. Other inherited genetic or chromosomal disorder  no 15. Maternal metabolic disorder (DM, PKU, etc)  no 16. Patient or FOB with a child with a birth defect not listed above no  16a. Patient or FOB with  a birth defect themselves no 17. Recurrent pregnancy loss, or stillbirth  no  18. Any medications since LMP other than prenatal vitamins (include vitamins, supplements, OTC meds, drugs, alcohol)  no 19. Any other genetic/environmental exposure to discuss  no  Infection History:   1. Lives with someone with TB or TB exposed  no  2. Patient or partner has history of genital herpes  no 3. Rash or viral illness since LMP  no 4. History of STI (GC, CT, HPV, syphilis, HIV)  no 5. History of recent travel :  no  Other pertinent information:  no   Review of Systems:10 point review of systems negative unless otherwise noted in HPI  Past Medical History:  Past Medical History:  Diagnosis Date  . Pap smear abnormality of cervix 02/2014   ASCUS    Past Surgical History:  Past Surgical History:  Procedure Laterality Date  . WISDOM TOOTH EXTRACTION      Gynecologic History: Patient's last menstrual period was 02/13/2018 (exact date).  Obstetric History: [id number]  Family History:  Family History  Problem Relation Age of Onset  . Diabetes Mother   . Heart disease Maternal Grandfather   . Cancer Paternal Grandmother   . Heart disease Paternal Grandfather     Social History:  Social History   Socioeconomic History  . Marital status: Married    Spouse name: Not on file  . Number of children: Not on file  . Years of education: Not  on file  . Highest education level: Not on file  Occupational History  . Not on file  Social Needs  . Financial resource strain: Not on file  . Food insecurity:    Worry: Not on file    Inability: Not on file  . Transportation needs:    Medical: Not on file    Non-medical: Not on file  Tobacco Use  . Smoking status: Never Smoker  . Smokeless tobacco: Never Used  Substance and Sexual Activity  . Alcohol use: No  . Drug use: No  . Sexual activity: Yes    Birth control/protection: Condom  Lifestyle  . Physical activity:    Days per week: Not  on file    Minutes per session: Not on file  . Stress: Not on file  Relationships  . Social connections:    Talks on phone: Not on file    Gets together: Not on file    Attends religious service: Not on file    Active member of club or organization: Not on file    Attends meetings of clubs or organizations: Not on file    Relationship status: Not on file  . Intimate partner violence:    Fear of current or ex partner: Not on file    Emotionally abused: Not on file    Physically abused: Not on file    Forced sexual activity: Not on file  Other Topics Concern  . Not on file  Social History Narrative  . Not on file    Allergies:  No Known Allergies  Medications: Prior to Admission medications   Medication Sig Start Date End Date Taking? Authorizing Provider  Lactobacillus (PROBIOTIC ACIDOPHILUS PO) Take 1 tablet by mouth daily.    [provider]  Prenatal Vit-Fe Fumarate-FA (MULTIVITAMIN-PRENATAL) 27-0.8 MG TABS tablet Take 1 tablet by mouth daily at 12 noon.    [provider]    Physical Exam Vitals: Blood pressure 116/65, weight 131 lb (59.4 kg), last menstrual period 02/13/2018.  General: NAD HEENT: normocephalic, anicteric Thyroid: no enlargement, no palpable nodules Pulmonary: No increased work of breathing, CTAB Cardiovascular: RRR, distal pulses 2+ Abdomen: NABS, soft, non-tender, non-distended.  Umbilicus without lesions.  No hepatomegaly, splenomegaly or masses palpable. No evidence of hernia  Genitourinary:  External: Normal external female genitalia.  Normal urethral meatus, normal  Bartholin's and Skene's glands.    Vagina: Normal vaginal mucosa, no evidence of prolapse.    Cervix: Grossly normal in appearance, no bleeding  Uterus: Non-enlarged, mobile, normal contour.  No CMT  Adnexa: ovaries non-enlarged, no adnexal masses  Rectal: deferred Extremities: no edema, erythema, or tenderness Neurologic: Grossly intact Psychiatric: mood  appropriate, affect full   Assessment: 38 y.o. [id number] at [redacted]w[redacted]d presenting to initiate prenatal care  Plan: 1) Avoid alcoholic beverages. 2) Patient encouraged not to smoke.  3) Discontinue the use of all non-medicinal drugs and chemicals.  4) Take prenatal vitamins daily.  5) Nutrition, food safety (fish, cheese advisories, and high nitrite foods) and exercise discussed. 6) Hospital and practice style discussed with cross coverage system.  7) Genetic Screening, such as with 1st Trimester Screening, cell free fetal DNA, AFP testing, and Ultrasound, as well as with amniocentesis and CVS as appropriate, is discussed with patient. At the conclusion of today's visit patient requested genetic testing 8) [name] asked about travel to areas at risk for the Zika virus, and counseled to avoid travel and exposure to mosquitoes or sexual partners who may have themselves  been exposed to the virus. Testing is discussed, and will be ordered as appropriate.   Vena Austria, MD, [facility], [facility] 03/30/2018, 10:51 AM

## 2018-03-31 LAB — RPR+RH+ABO+RUB AB+AB SCR+CB...
Antibody Screen: NEGATIVE
HEMATOCRIT: 44.9 % (ref 34.0–46.6)
HEP B S AG: NEGATIVE
HIV SCREEN 4TH GENERATION: NONREACTIVE
Hemoglobin: 15.1 g/dL (ref 11.1–15.9)
MCH: 28.9 pg (ref 26.6–33.0)
MCHC: 33.6 g/dL (ref 31.5–35.7)
MCV: 86 fL (ref 79–97)
PLATELETS: 272 10*3/uL (ref 150–450)
RBC: 5.23 x10E6/uL (ref 3.77–5.28)
RDW: 12.5 % (ref 11.7–15.4)
RH TYPE: POSITIVE
RPR Ser Ql: NONREACTIVE
Rubella Antibodies, IGG: 11.4 index (ref >0.99)
VARICELLA: 780 {index} (ref >165)
WBC: 8.1 10*3/uL (ref 3.4–10.8)

## 2018-03-31 LAB — CERVICOVAGINAL ANCILLARY ONLY
Chlamydia: NEGATIVE
NEISSERIA GONORRHEA: NEGATIVE

## 2018-04-01 LAB — URINE CULTURE

## 2018-04-06 ENCOUNTER — Other Ambulatory Visit: Payer: Self-pay | Admitting: Obstetrics and Gynecology

## 2018-04-06 ENCOUNTER — Ambulatory Visit (INDEPENDENT_AMBULATORY_CARE_PROVIDER_SITE_OTHER): Payer: 59 | Admitting: Obstetrics and Gynecology

## 2018-04-06 ENCOUNTER — Ambulatory Visit (INDEPENDENT_AMBULATORY_CARE_PROVIDER_SITE_OTHER): Payer: 59

## 2018-04-06 VITALS — BP 110/60 | Wt 132.0 lb

## 2018-04-06 DIAGNOSIS — N8312 Corpus luteum cyst of left ovary: Secondary | ICD-10-CM | POA: Diagnosis not present

## 2018-04-06 DIAGNOSIS — O0990 Supervision of high risk pregnancy, unspecified, unspecified trimester: Secondary | ICD-10-CM

## 2018-04-06 DIAGNOSIS — Z3A01 Less than 8 weeks gestation of pregnancy: Secondary | ICD-10-CM | POA: Diagnosis not present

## 2018-04-06 DIAGNOSIS — O09521 Supervision of elderly multigravida, first trimester: Secondary | ICD-10-CM | POA: Diagnosis not present

## 2018-04-06 DIAGNOSIS — O3481 Maternal care for other abnormalities of pelvic organs, first trimester: Secondary | ICD-10-CM | POA: Diagnosis not present

## 2018-04-06 DIAGNOSIS — Z3689 Encounter for other specified antenatal screening: Secondary | ICD-10-CM

## 2018-04-06 DIAGNOSIS — O09529 Supervision of elderly multigravida, unspecified trimester: Secondary | ICD-10-CM

## 2018-04-06 DIAGNOSIS — O0991 Supervision of high risk pregnancy, unspecified, first trimester: Secondary | ICD-10-CM

## 2018-04-06 LAB — POCT URINALYSIS DIPSTICK OB
Glucose, UA: NEGATIVE
POC,PROTEIN,UA: NEGATIVE

## 2018-04-07 ENCOUNTER — Encounter: Payer: 59 | Admitting: Obstetrics and Gynecology

## 2018-04-07 NOTE — Progress Notes (Signed)
Routine Prenatal Care Visit  Subjective  Chelsea Sherman is a 38 y.o. [phone] at [redacted]w[redacted]d being seen today for ongoing prenatal care.  She is currently monitored for the following issues for this high-risk pregnancy and has Antepartum multigravida of advanced maternal age and Supervision of high risk pregnancy, antepartum on their problem list.  ----------------------------------------------------------------------------------- Patient reports no complaints.    . Vag. Bleeding: None.   . Denies leaking of fluid.  ----------------------------------------------------------------------------------- The following portions of the patient's history were reviewed and updated as appropriate: allergies, current medications, past family history, past medical history, past social history, past surgical history and problem list. Problem list updated.   Objective  Blood pressure 110/60, weight 132 lb (59.9 kg), last menstrual period 02/13/2018. Pregravid weight 131 lb (59.4 kg) Total Weight Gain 1 lb (0.454 kg) Urinalysis:      Fetal Status:           General:  Alert, oriented and cooperative. [name] in no acute distress.  Skin: Skin is warm and dry. No rash noted.   Cardiovascular: Normal heart rate noted  Respiratory: Normal respiratory effort, no problems with respiration noted  Abdomen: Soft, gravid, appropriate for gestational age. Pain/Pressure: Absent     Pelvic:  Cervical exam deferred        Extremities: Normal range of motion.     ental Status: Normal mood and affect. Normal behavior. Normal judgment and thought content.   US Ob Transvaginal  Result Date: 04/06/2018 Patient Name: Chelsea Sherman DOB: [birth year] MRN: [id number] ULTRASOUND REPORT Location: [facility] Date of Service: 04/06/2018 Indications:dating Findings: Mason Jim intrauterine pregnancy is visualized with a CRL consistent with 108w4d gestation, giving an (U/S) EDD of 11/19/18. The (U/S) EDD is consistent with the  clinically established EDD of 11/20/18. FHR: 163 BPM CRL measurement: 13.1 mm Yolk sac is visualized and appears normal and early anatomy is normal. Amnion: visualized and appears normal Right Ovary is normal in appearance. Left Ovary is normal appearance. Corpus luteal cyst:  Left ovary Survey of the adnexa demonstrates no adnexal masses. There is no free peritoneal fluid in the cul de sac. Impression: 1. [redacted]w[redacted]d Viable Singleton Intrauterine pregnancy by U/S. 2. (U/S) EDD is consistent with Clinically established EDD of 11/20/18. Recommendations: 1.Clinical correlation with the patient's History and Physical Exam. Darlina Guys, RDMS RVT There is a singleton gestation with normal amniotic fluid volume. The fetal biometry correlates with established dating.  Limited fetal anatomy was performed.The visualized fetal anatomical survey appears within normal limits within the resolution of ultrasound as described above.  It must be noted that a normal ultrasound is unable to rule out fetal aneuploidy.  Vena Austria, MD, Evern Core [facility], [facility] 04/06/2018, 11:01 AM    Assessment   38 y.o. [phone] at [redacted]w[redacted]d by  11/20/2018, by Last Menstrual Period presenting for routine prenatal visit  Plan   Pregnancy#3 Problems (from 02/13/18 to present)    Problem Noted Resolved   Antepartum multigravida of advanced maternal age 31/22/2020 by Vena Austria, MD No   Overview Addendum 03/30/2018  8:34 PM by Vena Austria, MD           Supervision of high risk pregnancy, antepartum 03/30/2018 by Vena Austria, MD No   Overview Addendum 04/07/2018  9:17 PM by Vena Austria, MD    Clinic [facility] Prenatal Labs  Dating LMP = 7 week [place] Blood type:  O pos  Genetic Screen AFP:      NIPS:  Antibody: negative  Anatomic US  Rubella: Immune  Varicella: Immune  GTT  RPR:   NR  Rhogam  HBsAg:   neg  TDaP vaccine                       Flu Shot: HIV:   neg  Baby Food                                 GBS:   Contraception  Pap: 02/08/2018 NIL HPV negative  CBB     CS/VBAC N/A   Support Person Husband Royal HawthornKarl              Gestational age appropriate obstetric precautions including but not limited to vaginal bleeding, contractions, leaking of fluid and fetal movement were reviewed in detail with the patient.    Return in about 4 weeks (around 05/04/2018) for rob.  Vena AustriaAndreas Hussien Greenblatt, MD, Evern CoreFACOG [facility], [facility] 04/07/2018, 9:15 PM

## 2018-05-04 ENCOUNTER — Ambulatory Visit (INDEPENDENT_AMBULATORY_CARE_PROVIDER_SITE_OTHER): Payer: 59 | Admitting: Obstetrics and Gynecology

## 2018-05-04 VITALS — BP 120/80 | Wt 133.0 lb

## 2018-05-04 DIAGNOSIS — O0990 Supervision of high risk pregnancy, unspecified, unspecified trimester: Secondary | ICD-10-CM

## 2018-05-04 DIAGNOSIS — Z1379 Encounter for other screening for genetic and chromosomal anomalies: Secondary | ICD-10-CM

## 2018-05-04 DIAGNOSIS — O09521 Supervision of elderly multigravida, first trimester: Secondary | ICD-10-CM

## 2018-05-04 DIAGNOSIS — Z3A11 11 weeks gestation of pregnancy: Secondary | ICD-10-CM

## 2018-05-04 DIAGNOSIS — Z31438 Encounter for other genetic testing of female for procreative management: Secondary | ICD-10-CM

## 2018-05-04 DIAGNOSIS — O09529 Supervision of elderly multigravida, unspecified trimester: Secondary | ICD-10-CM

## 2018-05-04 LAB — POCT URINALYSIS DIPSTICK OB
GLUCOSE, UA: NEGATIVE
POC,PROTEIN,UA: NEGATIVE

## 2018-05-04 NOTE — Progress Notes (Signed)
21   Routine Prenatal Care Visit  Subjective  Chelsea Sherman is a 38 y.o. [phone] at [redacted]w[redacted]d being seen today for ongoing prenatal care.  She is currently monitored for the following issues for this low-risk pregnancy and has Antepartum multigravida of advanced maternal age and Supervision of high risk pregnancy, antepartum on their problem list.  ----------------------------------------------------------------------------------- Patient reports no complaints.    . Vag. Bleeding: None.  Movement: Absent. Denies leaking of fluid.  ----------------------------------------------------------------------------------- The following portions of the patient's history were reviewed and updated as appropriate: allergies, current medications, past family history, past medical history, past social history, past surgical history and problem list. Problem list updated.   Objective  Blood pressure 120/80, weight 133 lb (60.3 kg), last menstrual period 02/13/2018. Pregravid weight 131 lb (59.4 kg) Total Weight Gain 2 lb (0.907 kg) Urinalysis:      Fetal Status: Fetal Heart Rate (bpm): 150   Movement: Absent     General:  Alert, oriented and cooperative. [name] in no acute distress.  Skin: Skin is warm and dry. No rash noted.   Cardiovascular: Normal heart rate noted  Respiratory: Normal respiratory effort, no problems with respiration noted  Abdomen: Soft, gravid, appropriate for gestational age. Pain/Pressure: Absent     Pelvic:  Cervical exam deferred        Extremities: Normal range of motion.     ental Status: Normal mood and affect. Normal behavior. Normal judgment and thought content.     Assessment   38 y.o. [id number] at [redacted]w[redacted]d by  11/20/2018, by Last Menstrual Period presenting for routine prenatal visit  Plan   Pregnancy#3 Problems (from 02/13/18 to present)    Problem Noted Resolved   Antepartum multigravida of advanced maternal age 76/22/2020 by Vena Austria, MD No   Overview Addendum 03/30/2018  8:34 PM by Vena Austria, MD           Supervision of high risk pregnancy, antepartum 03/30/2018 by Vena Austria, MD No   Overview Addendum 04/07/2018  9:17 PM by Vena Austria, MD    Clinic [facility] Prenatal Labs  Dating LMP = 7 week [place] Blood type:  O pos  Genetic Screen AFP:      NIPS: Antibody: negative  Anatomic [place]  Rubella: Immune  Varicella: Immune  GTT  RPR:   NR  Rhogam  HBsAg:   neg  TDaP vaccine                       Flu Shot: HIV:   neg  Baby Food                                GBS:   Contraception  Pap: 02/08/2018 NIL HPV negative  CBB     CS/VBAC N/A   Support Person Husband Royal Hawthorn              Gestational age appropriate obstetric precautions including but not limited to vaginal bleeding, contractions, leaking of fluid and fetal movement were reviewed in detail with the patient.    - maternit21 and inheritest today  Return in about 4 years (around 05/04/2022) for ROB.  Vena Austria, MD, Evern Core [facility], [facility] 05/04/2018, 10:27 AM

## 2018-05-13 ENCOUNTER — Telehealth: Payer: Self-pay

## 2018-05-13 NOTE — Telephone Encounter (Signed)
Pt calling for blood results from Wed of last week.  [phone]  Adv pt results are not back yet.  It usually takes 14d for results to come.  May try calling us back toward the end of next week.

## 2018-05-16 LAB — MATERNIT 21 PLUS CORE, BLOOD

## 2018-05-17 ENCOUNTER — Other Ambulatory Visit: Payer: 59

## 2018-05-17 ENCOUNTER — Other Ambulatory Visit: Payer: Self-pay | Admitting: Advanced Practice Midwife

## 2018-05-17 DIAGNOSIS — Z1379 Encounter for other screening for genetic and chromosomal anomalies: Secondary | ICD-10-CM

## 2018-05-17 LAB — INHERITEST CORE(CF97,SMA,FRAX)

## 2018-05-17 NOTE — Progress Notes (Signed)
MaterniT 21 re-ordered

## 2018-05-23 LAB — MATERNIT 21 PLUS CORE, BLOOD
CHROMOSOME 18: NEGATIVE
CHROMOSOME 21: NEGATIVE
Chromosome 13: NEGATIVE
Fetal Fraction: 6
Y CHROMOSOME: NOT DETECTED

## 2018-06-02 ENCOUNTER — Ambulatory Visit (INDEPENDENT_AMBULATORY_CARE_PROVIDER_SITE_OTHER): Payer: 59 | Admitting: Obstetrics and Gynecology

## 2018-06-02 ENCOUNTER — Other Ambulatory Visit: Payer: Self-pay

## 2018-06-02 DIAGNOSIS — O09522 Supervision of elderly multigravida, second trimester: Secondary | ICD-10-CM

## 2018-06-02 DIAGNOSIS — Z363 Encounter for antenatal screening for malformations: Secondary | ICD-10-CM

## 2018-06-02 DIAGNOSIS — Z3A15 15 weeks gestation of pregnancy: Secondary | ICD-10-CM

## 2018-06-02 NOTE — Progress Notes (Signed)
    Routine Prenatal Care Visit  Subjective  Chelsea Sherman is a 38 y.o. [phone] at [redacted]w[redacted]d being seen today for ongoing prenatal care.  She is currently monitored for the following issues for this high-risk pregnancy and has Antepartum multigravida of advanced maternal age and Supervision of high risk pregnancy, antepartum on their problem list.  ----------------------------------------------------------------------------------- Patient reports no complaints.    .  .   . Denies leaking of fluid.  ----------------------------------------------------------------------------------- The following portions of the patient's history were reviewed and updated as appropriate: allergies, current medications, past family history, past medical history, past social history, past surgical history and problem list. Problem list updated.  Assessment   38 y.o. [id number] at [redacted]w[redacted]d by  11/20/2018, by Last Menstrual Period presenting for routine prenatal visit  Plan   Pregnancy#3 Problems (from 02/13/18 to present)    Problem Noted Resolved   Antepartum multigravida of advanced maternal age 61/22/2020 by Vena Austria, MD No   Overview Addendum 03/30/2018  8:34 PM by Vena Austria, MD           Supervision of high risk pregnancy, antepartum 03/30/2018 by Vena Austria, MD No   Overview Addendum 05/17/2018  7:56 AM by Vena Austria, MD    Clinic [facility] Prenatal Labs  Dating LMP = 7 week [place] Blood type:  O pos  Genetic Screen AFP:      NIPS: initial sample rejected Inheritest: negative Antibody: negative  Anatomic [place]  Rubella: Immune  Varicella: Immune  GTT  RPR:   NR  Rhogam  HBsAg:   neg  TDaP vaccine                       Flu Shot: HIV:   neg  Baby Food                                GBS:   Contraception  Pap: 02/08/2018 NIL HPV negative  CBB     CS/VBAC N/A   Support Person Husband Royal Hawthorn              Gestational age appropriate obstetric precautions including but not limited  to vaginal bleeding, contractions, leaking of fluid and fetal movement were reviewed in detail with the patient.    - discussed pregnancy map of visits going forward given COVID-19  Return in about 5 weeks (around 07/07/2018) for ROB and anatomy scan.  Vena Austria, MD, Evern Core [facility], [facility] 06/02/2018, 5:00 PM

## 2018-07-13 ENCOUNTER — Encounter: Payer: 59 | Admitting: Obstetrics and Gynecology

## 2018-07-13 ENCOUNTER — Other Ambulatory Visit: Payer: Self-pay

## 2018-07-13 ENCOUNTER — Ambulatory Visit (INDEPENDENT_AMBULATORY_CARE_PROVIDER_SITE_OTHER): Payer: 59

## 2018-07-13 ENCOUNTER — Ambulatory Visit (INDEPENDENT_AMBULATORY_CARE_PROVIDER_SITE_OTHER): Payer: 59 | Admitting: Obstetrics and Gynecology

## 2018-07-13 VITALS — BP 114/72 | Wt 139.0 lb

## 2018-07-13 DIAGNOSIS — O09522 Supervision of elderly multigravida, second trimester: Secondary | ICD-10-CM

## 2018-07-13 DIAGNOSIS — O09529 Supervision of elderly multigravida, unspecified trimester: Secondary | ICD-10-CM

## 2018-07-13 DIAGNOSIS — Z3A21 21 weeks gestation of pregnancy: Secondary | ICD-10-CM

## 2018-07-13 DIAGNOSIS — Z363 Encounter for antenatal screening for malformations: Secondary | ICD-10-CM | POA: Diagnosis not present

## 2018-07-13 DIAGNOSIS — O0990 Supervision of high risk pregnancy, unspecified, unspecified trimester: Secondary | ICD-10-CM

## 2018-07-13 LAB — POCT URINALYSIS DIPSTICK OB
Glucose, UA: NEGATIVE
POC,PROTEIN,UA: NEGATIVE

## 2018-07-13 NOTE — Progress Notes (Signed)
Routine Prenatal Care Visit  Subjective  Chelsea Sherman is a 38 y.o. [phone] at [redacted]w[redacted]d being seen today for ongoing prenatal care.  She is currently monitored for the following issues for this high-risk pregnancy and has Antepartum multigravida of advanced maternal age and Supervision of high risk pregnancy, antepartum on their problem list.  ----------------------------------------------------------------------------------- Patient reports no complaints.   Contractions: Not present. Vag. Bleeding: None.  Movement: Present. Denies leaking of fluid.  ----------------------------------------------------------------------------------- The following portions of the patient's history were reviewed and updated as appropriate: allergies, current medications, past family history, past medical history, past social history, past surgical history and problem list. Problem list updated.   Objective  Blood pressure 114/72, weight 139 lb (63 kg), last menstrual period 02/13/2018. Pregravid weight 131 lb (59.4 kg) Total Weight Gain 8 lb (3.629 kg) Urinalysis:      Fetal Status: Fetal Heart Rate (bpm): 140   Movement: Present     General:  Alert, oriented and cooperative. [name] in no acute distress.  Skin: Skin is warm and dry. No rash noted.   Cardiovascular: Normal heart rate noted  Respiratory: Normal respiratory effort, no problems with respiration noted  Abdomen: Soft, gravid, appropriate for gestational age. Pain/Pressure: Absent     Pelvic:  Cervical exam deferred        Extremities: Normal range of motion.     ental Status: Normal mood and affect. Normal behavior. Normal judgment and thought content.   US Ob Comp + 14 Wk  Result Date: 07/13/2018 Patient Name: Chelsea Sherman DOB: 1981-01-28 MRN: [id number] ULTRASOUND REPORT Location: [facility] Date of Service: 07/13/2018 Indications:Anatomy Ultrasound Findings: Mason Jim intrauterine pregnancy is visualized with FHR at 141 BPM.  Biometrics give an (U/S) Gestational age of [redacted]w[redacted]d and an (U/S) EDD of 11/21/2018; this correlates with the clinically established Estimated Date of Delivery: 11/20/18 Fetal presentation is Variable. EFW: 407 g (14 oz). Placenta: posterior. Grade: 1 AFI: subjectively normal. Anatomic survey is complete and normal; Gender - female.  Right Ovary is normal in appearance. Left Ovary is normal appearance. Survey of the adnexa demonstrates no adnexal masses. There is no free peritoneal fluid in the cul de sac. Impression: 1. [redacted]w[redacted]d Viable Singleton Intrauterine pregnancy by U/S. 2. (U/S) EDD is consistent with Clinically established Estimated Date of Delivery: 11/20/18 . 3. Normal Anatomy Scan Recommendations: 1.Clinical correlation with the patient's History and Physical Exam. Clydene Laming, RDMS  There is a singleton gestation with subjectively normal amniotic fluid volume. The fetal biometry correlates with established dating. Detailed evaluation of the fetal anatomy was performed.The fetal anatomical survey appears within normal limits within the resolution of ultrasound as described above.  It must be noted that a normal ultrasound is unable to rule out fetal aneuploidy.  Vena Austria, MD, [facility] Core [facility], [facility] 07/13/2018, 9:51 AM     Assessment   38 y.o. [id number] at [redacted]w[redacted]d by  11/20/2018, by Last Menstrual Period presenting for routine prenatal visit  Plan   Pregnancy#3 Problems (from 02/13/18 to present)    Problem Noted Resolved   Antepartum multigravida of advanced maternal age 36/22/2020 by Vena Austria, MD No   Overview Addendum 03/30/2018  8:34 PM by Vena Austria, MD           Supervision of high risk pregnancy, antepartum 03/30/2018 by Vena Austria, MD No   Overview Addendum 07/13/2018 10:24 AM by Vena Austria, MD    [facility] Prenatal Labs  Dating LMP =  7 week US Blood type:  O pos  Genetic Screen NIPS: Repeat XX Inheritest: negative   Antibody: negative  Anatomic US Normal Rubella: Immune  Varicella: Immune  GTT  RPR:   NR  Rhogam  HBsAg:   neg  TDaP vaccine                       Flu Shot: HIV:   neg  Baby Food                                GBS:   Contraception  Pap: 02/08/2018 NIL HPV negative  CBB     CS/VBAC N/A   Support Person Husband Royal HawthornKarl              Gestational age appropriate obstetric precautions including but not limited to vaginal bleeding, contractions, leaking of fluid and fetal movement were reviewed in detail with the patient.     Return in about 4 weeks (around 08/10/2018) for 4 weeks ROB (phone), 7 weeks ROB in person 28 week labs.  Vena AustriaAndreas Altie Savard, MD, Evern CoreFACOG [facility], [facility] 07/13/2018, 10:26 AM

## 2018-07-13 NOTE — Progress Notes (Signed)
ROB  °Anatomy scan °

## 2018-07-29 NOTE — Telephone Encounter (Signed)
Pt's hsb, Chelsea Sherman, calling regarding bill(s) from [facility] for MaterniT21.  The test was done in 04/2018  - it didn't generate the results needed.  Test done 05/2018 he has received 2 separate bills from [facility].  Discussed 05/2018 bill c [employer] who said they shouldn't pay bill - it will be sorted out by [facility].  Now has received 2nd bill from March and it's still pending.  He is concerned.  When will the bill be cleared.  What's going on with the testing?  Please call.  [phone]

## 2018-08-11 ENCOUNTER — Ambulatory Visit (INDEPENDENT_AMBULATORY_CARE_PROVIDER_SITE_OTHER): Payer: 59 | Admitting: Obstetrics and Gynecology

## 2018-08-11 ENCOUNTER — Other Ambulatory Visit: Payer: Self-pay

## 2018-08-11 VITALS — Wt 140.0 lb

## 2018-08-11 DIAGNOSIS — O09522 Supervision of elderly multigravida, second trimester: Secondary | ICD-10-CM

## 2018-08-11 DIAGNOSIS — O09529 Supervision of elderly multigravida, unspecified trimester: Secondary | ICD-10-CM

## 2018-08-11 DIAGNOSIS — O0990 Supervision of high risk pregnancy, unspecified, unspecified trimester: Secondary | ICD-10-CM

## 2018-08-11 DIAGNOSIS — Z3A25 25 weeks gestation of pregnancy: Secondary | ICD-10-CM

## 2018-08-11 DIAGNOSIS — O0992 Supervision of high risk pregnancy, unspecified, second trimester: Secondary | ICD-10-CM

## 2018-08-11 MED ORDER — METRONIDAZOLE 500 MG PO TABS: 500.0000 mg | ORAL_TABLET | Freq: Two times a day (BID) | ORAL | 0 refills | Status: AC

## 2018-08-11 MED ORDER — FLUCONAZOLE 150 MG PO TABS: 150.0000 mg | ORAL_TABLET | Freq: Once | ORAL | 0 refills | Status: AC

## 2018-08-11 NOTE — Progress Notes (Signed)
ROB televisit Vaginal irritation w/odor after intercourse

## 2018-08-11 NOTE — Progress Notes (Signed)
    Routine Prenatal Care Visit  Subjective  Chelsea Sherman is a 38 y.o. [phone] at [redacted]w[redacted]d being seen today for ongoing prenatal care.  She is currently monitored for the following issues for this low-risk pregnancy and has Antepartum multigravida of advanced maternal age and Supervision of high risk pregnancy, antepartum on their problem list.  ----------------------------------------------------------------------------------- Patient reports no complaints.    .  .   . Denies leaking of fluid.  ----------------------------------------------------------------------------------- The following portions of the patient's history were reviewed and updated as appropriate: allergies, current medications, past family history, past medical history, past social history, past surgical history and problem list. Problem list updated.   Objective  Weight 140 lb (63.5 kg), last menstrual period 02/13/2018. Pregravid weight 131 lb (59.4 kg) Total Weight Gain 9 lb (4.082 kg) Urinalysis:      Fetal Status:           General:  Alert, oriented and cooperative. [name] in no acute distress.  Skin: Skin is warm and dry. No rash noted.   Cardiovascular: Normal heart rate noted  Respiratory: Normal respiratory effort, no problems with respiration noted  Abdomen: Soft, gravid, appropriate for gestational age.       Pelvic:  Cervical exam performed        Extremities: Normal range of motion.     ental Status: Normal mood and affect. Normal behavior. Normal judgment and thought content.     Assessment   38 y.o. [id number] at [redacted]w[redacted]d by  11/20/2018, by Last Menstrual Period presenting for routine prenatal visit  Plan   Pregnancy#3 Problems (from 02/13/18 to present)    Problem Noted Resolved   Antepartum multigravida of advanced maternal age 73/22/2020 by Vena Austria, MD No   Overview Addendum 03/30/2018  8:34 PM by Vena Austria, MD           Supervision of high risk pregnancy, antepartum  03/30/2018 by Vena Austria, MD No   Overview Addendum 07/13/2018 10:24 AM by Vena Austria, MD    [facility]inic [facility]atal [facility]  Dating LMP = 7 week [place] Blood type:  O pos  Genetic Screen NIPS: Repeat XX Inheritest: negative  Antibody: negative  Anatomic [place] Normal Rubella: Immune  Varicella: Immune  GTT  RPR:   NR  Rhogam  HBsAg:   neg  TDaP vaccine                       Flu Shot: HIV:   neg  Baby Food                                GBS:   Contraception  Pap: 02/08/2018 NIL HPV negative  CBB     CS/VBAC N/A   Support Person Husband Chelsea Sherman              Gestational age appropriate obstetric precautions including but not limited to vaginal bleeding, contractions, leaking of fluid and fetal movement were reviewed in detail with the patient.    - still having postcoital issues with vaginal irriation, discharge, and odor - Rx diflucan and flagyl - nuswab next visit if symptoms persist   Return in about 3 weeks (around 09/01/2018) for ROB and 28 week labs.  Vena Austria, MD, Evern Core [facility], [facility] 08/11/2018, 3:57 PM

## 2018-08-16 ENCOUNTER — Telehealth: Payer: Self-pay

## 2018-08-16 NOTE — Telephone Encounter (Signed)
Dawn, from [employer] calling due to [employer]MS ordered some testing on pt. Reviewing of records are needed for lab completion of the testing process. CB# [phone], Fax# [phone]   [facility], please let Kenney Houseman know what to fax. Thanks

## 2018-08-18 NOTE — Telephone Encounter (Signed)
Not sure what records they want or would need.  She is AMA which is the indication for the testing.  I also don't know if her pregnancy medicaid went through.  Her insurance that it was billed through doesn't accept [facility] and when I spoke with Anderson Malta she said [facility] just eat the cost on those patients

## 2018-08-23 NOTE — Telephone Encounter (Signed)
Dawn from [employer] to calling to follow up with Maudie Mercury about a message she left last week. Please contact her back at [phone]

## 2018-08-29 ENCOUNTER — Other Ambulatory Visit: Payer: 59

## 2018-08-29 ENCOUNTER — Other Ambulatory Visit: Payer: Self-pay

## 2018-08-29 ENCOUNTER — Ambulatory Visit (INDEPENDENT_AMBULATORY_CARE_PROVIDER_SITE_OTHER): Payer: 59 | Admitting: Obstetrics and Gynecology

## 2018-08-29 VITALS — BP 106/68 | Wt 150.0 lb

## 2018-08-29 DIAGNOSIS — B373 Candidiasis of vulva and vagina: Secondary | ICD-10-CM

## 2018-08-29 DIAGNOSIS — B3731 Acute candidiasis of vulva and vagina: Secondary | ICD-10-CM

## 2018-08-29 DIAGNOSIS — O0990 Supervision of high risk pregnancy, unspecified, unspecified trimester: Secondary | ICD-10-CM

## 2018-08-29 DIAGNOSIS — O09529 Supervision of elderly multigravida, unspecified trimester: Secondary | ICD-10-CM

## 2018-08-29 DIAGNOSIS — Z3A28 28 weeks gestation of pregnancy: Secondary | ICD-10-CM

## 2018-08-29 DIAGNOSIS — O09523 Supervision of elderly multigravida, third trimester: Secondary | ICD-10-CM

## 2018-08-29 DIAGNOSIS — O0993 Supervision of high risk pregnancy, unspecified, third trimester: Secondary | ICD-10-CM

## 2018-08-29 NOTE — Progress Notes (Signed)
    Routine Prenatal Care Visit  Subjective  Chelsea Sherman is a 38 y.o. [phone] at [redacted]w[redacted]d being seen today for ongoing prenatal care.  She is currently monitored for the following issues for this high-risk pregnancy and has Antepartum multigravida of advanced maternal age and Supervision of high risk pregnancy, antepartum on their problem list.  ----------------------------------------------------------------------------------- Patient reports no complaints.   Contractions: Not present. Vag. Bleeding: None.  Movement: Present. Denies leaking of fluid.  ----------------------------------------------------------------------------------- The following portions of the patient's history were reviewed and updated as appropriate: allergies, current medications, past family history, past medical history, past social history, past surgical history and problem list. Problem list updated.   Objective  Blood pressure 106/68, weight 150 lb (68 kg), last menstrual period 02/13/2018. Pregravid weight 131 lb (59.4 kg) Total Weight Gain 19 lb (8.618 kg) Urinalysis:      Fetal Status: Fetal Heart Rate (bpm): 135 Fundal Height: 28 cm Movement: Present     General:  Alert, oriented and cooperative. [name] in no acute distress.  Skin: Skin is warm and dry. No rash noted.   Cardiovascular: Normal heart rate noted  Respiratory: Normal respiratory effort, no problems with respiration noted  Abdomen: Soft, gravid, appropriate for gestational age. Pain/Pressure: Absent     Pelvic:  Cervical exam deferred        Extremities: Normal range of motion.     ental Status: Normal mood and affect. Normal behavior. Normal judgment and thought content.     Assessment   38 y.o. G1W2993 at [redacted]w[redacted]d by  11/20/2018, by Last Menstrual Period presenting for routine prenatal visit  Plan   Pregnancy#3 Problems (from 02/13/18 to present)    Problem Noted Resolved   Antepartum multigravida of advanced maternal age  79/22/2020 by Malachy Mood, MD No   Overview Addendum 03/30/2018  8:34 PM by Malachy Mood, MD           Supervision of high risk pregnancy, antepartum 03/30/2018 by Malachy Mood, MD No   Overview Addendum 07/13/2018 10:24 AM by Malachy Mood, MD    [facility] Prenatal Labs  Dating LMP = 7 week [place] Blood type:  O pos  Genetic Screen NIPS: Repeat XX Inheritest: negative  Antibody: negative  Anatomic [place] Normal Rubella: Immune  Varicella: Immune  GTT  RPR:   NR  Rhogam  HBsAg:   neg  TDaP vaccine                       Flu Shot: HIV:   neg  Baby Food                                GBS:   Contraception  Pap: 02/08/2018 NIL HPV negative  CBB     CS/VBAC N/A   Support Person Husband Peterson Ao              Gestational age appropriate obstetric precautions including but not limited to vaginal bleeding, contractions, leaking of fluid and fetal movement were reviewed in detail with the patient.    1) Recurrent candida - nuswab collected today  2) Prenatal care schedule - 28 week labs collected    Return in about 2 weeks (around 09/12/2018) for ROB.  Malachy Mood, MD, [facility], [facility] 08/29/2018, 9:37 AM

## 2018-08-29 NOTE — Progress Notes (Signed)
ROB/28 week labs C/o poss yeast infection  Denies lof, no vb, Good FM

## 2018-08-30 ENCOUNTER — Telehealth: Payer: Self-pay | Admitting: Obstetrics and Gynecology

## 2018-08-30 ENCOUNTER — Other Ambulatory Visit: Payer: Self-pay | Admitting: Obstetrics and Gynecology

## 2018-08-30 DIAGNOSIS — O0990 Supervision of high risk pregnancy, unspecified, unspecified trimester: Secondary | ICD-10-CM

## 2018-08-30 DIAGNOSIS — R7309 Other abnormal glucose: Secondary | ICD-10-CM | POA: Insufficient documentation

## 2018-08-30 DIAGNOSIS — O09529 Supervision of elderly multigravida, unspecified trimester: Secondary | ICD-10-CM

## 2018-08-30 LAB — 28 WEEK RH+PANEL
Basophils Absolute: 0.1 10*3/uL (ref 0.0–0.2)
Basos: 1 %
EOS (ABSOLUTE): 0.1 10*3/uL (ref 0.0–0.4)
Eos: 1 %
Gestational Diabetes Screen: 144 mg/dL — ABNORMAL HIGH (ref 65–139)
HIV Screen 4th Generation wRfx: NONREACTIVE
Hematocrit: 38.4 % (ref 34.0–46.6)
Hemoglobin: 12.5 g/dL (ref 11.1–15.9)
Immature Grans (Abs): 0.2 10*3/uL — ABNORMAL HIGH (ref 0.0–0.1)
Immature Granulocytes: 2 %
Lymphocytes Absolute: 1.4 10*3/uL (ref 0.7–3.1)
Lymphs: 15 %
MCH: 29.8 pg (ref 26.6–33.0)
MCHC: 32.6 g/dL (ref 31.5–35.7)
MCV: 91 fL (ref 79–97)
Monocytes Absolute: 0.4 10*3/uL (ref 0.1–0.9)
Monocytes: 4 %
Neutrophils Absolute: 7.2 10*3/uL — ABNORMAL HIGH (ref 1.4–7.0)
Neutrophils: 77 %
Platelets: 219 10*3/uL (ref 150–450)
RBC: 4.2 x10E6/uL (ref 3.77–5.28)
RDW: 11.6 % — ABNORMAL LOW (ref 11.7–15.4)
RPR Ser Ql: NONREACTIVE
WBC: 9.3 10*3/uL (ref 3.4–10.8)

## 2018-08-30 NOTE — Telephone Encounter (Signed)
Called and left voice mail for patient to call back to be schedule °

## 2018-08-30 NOTE — Telephone Encounter (Signed)
-----   Message from Malachy Mood, MD sent at 08/30/2018  9:55 AM EDT ----- Regarding: 3-hr glucose 3-hr glucose in the next 1-2 weeks order in patient aware.   Malachy Mood, MD, [facility], [facility] 08/30/2018, 9:56 AM

## 2018-08-31 ENCOUNTER — Telehealth: Payer: Self-pay

## 2018-08-31 NOTE — Telephone Encounter (Signed)
Dawn calling from [employer]; they requested pt's records but only received lab results.  They need records/office notes as well d/t non standard lab tests.  Dawn's # [phone]

## 2018-09-01 ENCOUNTER — Encounter: Payer: 59 | Admitting: Obstetrics and Gynecology

## 2018-09-01 ENCOUNTER — Other Ambulatory Visit: Payer: 59

## 2018-09-01 NOTE — Telephone Encounter (Signed)
Records request, please advise.

## 2018-09-02 LAB — NUSWAB BV AND CANDIDA, NAA
Candida albicans, NAA: NEGATIVE
Candida glabrata, NAA: NEGATIVE

## 2018-09-06 NOTE — Telephone Encounter (Signed)
Chelsea Sherman with [employer] is needing to get a letter of medical necessity for the Materniti 21 test that was ordered. Nothing in her notes as to why it was ordered. Test has been denied at this time as non standard testing.   CB# [phone]

## 2018-09-07 ENCOUNTER — Encounter: Payer: Self-pay | Admitting: Obstetrics and Gynecology

## 2018-09-07 NOTE — Telephone Encounter (Signed)
Sent a letter tonight regarding her testing

## 2018-09-13 ENCOUNTER — Ambulatory Visit (INDEPENDENT_AMBULATORY_CARE_PROVIDER_SITE_OTHER): Payer: 59 | Admitting: Obstetrics and Gynecology

## 2018-09-13 ENCOUNTER — Other Ambulatory Visit: Payer: 59

## 2018-09-13 ENCOUNTER — Encounter: Payer: Self-pay | Admitting: Obstetrics and Gynecology

## 2018-09-13 ENCOUNTER — Other Ambulatory Visit: Payer: Self-pay

## 2018-09-13 VITALS — BP 114/70 | Wt 151.0 lb

## 2018-09-13 DIAGNOSIS — O09529 Supervision of elderly multigravida, unspecified trimester: Secondary | ICD-10-CM

## 2018-09-13 DIAGNOSIS — O0993 Supervision of high risk pregnancy, unspecified, third trimester: Secondary | ICD-10-CM

## 2018-09-13 DIAGNOSIS — Z3A30 30 weeks gestation of pregnancy: Secondary | ICD-10-CM

## 2018-09-13 DIAGNOSIS — R7309 Other abnormal glucose: Secondary | ICD-10-CM

## 2018-09-13 DIAGNOSIS — O0990 Supervision of high risk pregnancy, unspecified, unspecified trimester: Secondary | ICD-10-CM

## 2018-09-13 DIAGNOSIS — O09523 Supervision of elderly multigravida, third trimester: Secondary | ICD-10-CM

## 2018-09-13 NOTE — Progress Notes (Signed)
Routine Prenatal Care Visit  Subjective  Chelsea Sherman is a 38 y.o. [phone] at 499w2d being seen today for ongoing prenatal care.  She is currently monitored for the following issues for this high-risk pregnancy and has Antepartum multigravida of advanced maternal age; Supervision of high risk pregnancy, antepartum; and Abnormal oral glucose tolerance test on their problem list.  ----------------------------------------------------------------------------------- Patient reports large amount of clear discharge in underwear when she woke up this morning. No blood. No continued leakage of fluid. No urinary symptoms..   Contractions: Not present. Vag. Bleeding: None.  Movement: Present. Denies leaking of fluid.  ----------------------------------------------------------------------------------- The following portions of the patient's history were reviewed and updated as appropriate: allergies, current medications, past family history, past medical history, past social history, past surgical history and problem list. Problem list updated.   Objective  Blood pressure 114/70, weight 151 lb (68.5 kg), last menstrual period 02/13/2018. Pregravid weight 131 lb (59.4 kg) Total Weight Gain 20 lb (9.072 kg) Urinalysis: Urine Protein    Urine Glucose    Fetal Status: Fetal Heart Rate (bpm): 155 Fundal Height: 30 cm Movement: Present     General:  Alert, oriented and cooperative. [name] in no acute distress.  Skin: Skin is warm and dry. No rash noted.   Cardiovascular: Normal heart rate noted  Respiratory: Normal respiratory effort, no problems with respiration noted  Abdomen: Soft, gravid, appropriate for gestational age. Pain/Pressure: Absent     Pelvic:  Cervical exam deferred        Extremities: Normal range of motion.  Edema: None  Mental Status: Normal mood and affect. Normal behavior. Normal judgment and thought content.   ROM workup: Pooling: negative Nitrazine: negative Ferning:  negative  Assessment   38 y.o. [id number] at 659w2d by  11/20/2018, by Last Menstrual Period presenting for routine prenatal visit  Plan   Pregnancy#3 Problems (from 02/13/18 to present)    Problem Noted Resolved   Antepartum multigravida of advanced maternal age 58/22/2020 by Vena AustriaStaebler, Andreas, MD No   Overview Addendum 03/30/2018  8:34 PM by Vena AustriaStaebler, Andreas, MD           Supervision of high risk pregnancy, antepartum 03/30/2018 by Vena AustriaStaebler, Andreas, MD No   Overview Addendum 08/30/2018  9:55 AM by Vena AustriaStaebler, Andreas, MD    [facility]  Dating LMP = 7 week US Blood type:  O pos  Genetic Screen NIPS: Repeat XX Inheritest: negative  Antibody: negative  Anatomic US Normal Rubella: Immune  Varicella: Immune  GTT 144 RPR:   NR  Rhogam  HBsAg:   neg  TDaP vaccine                       Flu Shot: HIV:   neg  Baby Food                                GBS:   Contraception  Pap: 02/08/2018 NIL HPV negative  CBB     CS/VBAC N/A   Support Person Husband Royal HawthornKarl              Preterm labor symptoms and general obstetric precautions including but not limited to vaginal bleeding, contractions, leaking of fluid and fetal movement were reviewed in detail with the patient. Please refer to After Visit Summary for other counseling recommendations.   - no evidence of ROM today. Precautions given - declines TDAP  Return in  about 2 weeks (around 09/27/2018) for Routine Prenatal Appointment/telephone.  Prentice Docker, MD, [facility], [facility] 09/13/2018 10:45 AM

## 2018-09-14 LAB — GESTATIONAL GLUCOSE TOLERANCE
Glucose, Fasting: 85 mg/dL (ref 65–94)
Glucose, GTT - 1 Hour: 187 mg/dL — ABNORMAL HIGH (ref 65–179)
Glucose, GTT - 2 Hour: 175 mg/dL — ABNORMAL HIGH (ref 65–154)
Glucose, GTT - 3 Hour: 124 mg/dL (ref 65–139)

## 2018-09-19 ENCOUNTER — Encounter: Payer: Self-pay | Admitting: Obstetrics and Gynecology

## 2018-09-19 ENCOUNTER — Other Ambulatory Visit: Payer: Self-pay | Admitting: Obstetrics and Gynecology

## 2018-09-19 DIAGNOSIS — O24419 Gestational diabetes mellitus in pregnancy, unspecified control: Secondary | ICD-10-CM | POA: Insufficient documentation

## 2018-09-19 MED ORDER — ONETOUCH VERIO VI STRP: ORAL_STRIP | 12 refills | Status: DC

## 2018-09-19 MED ORDER — ACCU-CHEK FASTCLIX LANCETS MISC: 1.0000 [IU] | Freq: Four times a day (QID) | 12 refills | Status: DC

## 2018-09-19 MED ORDER — ONETOUCH VERIO W/DEVICE KIT: 1.0000 | PACK | 0 refills | Status: DC

## 2018-09-27 ENCOUNTER — Ambulatory Visit (INDEPENDENT_AMBULATORY_CARE_PROVIDER_SITE_OTHER): Payer: 59 | Admitting: Obstetrics and Gynecology

## 2018-09-27 ENCOUNTER — Other Ambulatory Visit: Payer: Self-pay

## 2018-09-27 VITALS — BP 112/78 | Wt 157.0 lb

## 2018-09-27 DIAGNOSIS — O0990 Supervision of high risk pregnancy, unspecified, unspecified trimester: Secondary | ICD-10-CM

## 2018-09-27 DIAGNOSIS — O09523 Supervision of elderly multigravida, third trimester: Secondary | ICD-10-CM

## 2018-09-27 DIAGNOSIS — Z3A32 32 weeks gestation of pregnancy: Secondary | ICD-10-CM

## 2018-09-27 DIAGNOSIS — O09529 Supervision of elderly multigravida, unspecified trimester: Secondary | ICD-10-CM

## 2018-09-27 DIAGNOSIS — O0993 Supervision of high risk pregnancy, unspecified, third trimester: Secondary | ICD-10-CM

## 2018-09-27 DIAGNOSIS — O24410 Gestational diabetes mellitus in pregnancy, diet controlled: Secondary | ICD-10-CM

## 2018-09-27 LAB — POCT URINALYSIS DIPSTICK OB
Glucose, UA: NEGATIVE
POC,PROTEIN,UA: NEGATIVE

## 2018-09-27 NOTE — Progress Notes (Signed)
ROB

## 2018-09-28 NOTE — Progress Notes (Signed)
Routine Prenatal Care Visit  Subjective  Chelsea Sherman is a 38 y.o. [phone] at [redacted]w[redacted]d being seen today for ongoing prenatal care.  She is currently monitored for the following issues for this high-risk pregnancy and has Antepartum multigravida of advanced maternal age; Supervision of high risk pregnancy, antepartum; Abnormal oral glucose tolerance test; and Gestational diabetes on their problem list.  ----------------------------------------------------------------------------------- Patient reports no complaints.   Contractions: Not present. Vag. Bleeding: None.  Movement: Present. Denies leaking of fluid.  ----------------------------------------------------------------------------------- The following portions of the patient's history were reviewed and updated as appropriate: allergies, current medications, past family history, past medical history, past social history, past surgical history and problem list. Problem list updated.   Objective  Blood pressure 112/78, weight 157 lb (71.2 kg), last menstrual period 02/13/2018. Pregravid weight 131 lb (59.4 kg) Total Weight Gain 26 lb (11.8 kg) Urinalysis:      Fetal Status: Fetal Heart Rate (bpm): 150 Fundal Height: 31 cm Movement: Present     General:  Alert, oriented and cooperative. [name] in no acute distress.  Skin: Skin is warm and dry. No rash noted.   Cardiovascular: Normal heart rate noted  Respiratory: Normal respiratory effort, no problems with respiration noted  Abdomen: Soft, gravid, appropriate for gestational age. Pain/Pressure: Absent     Pelvic:  Cervical exam deferred        Extremities: Normal range of motion.     ental Status: Normal mood and affect. Normal behavior. Normal judgment and thought content.     Assessment   38 y.o. P5T6144 at [redacted]w[redacted]d by  11/20/2018, by Last Menstrual Period presenting for routine prenatal visit  Plan   Pregnancy#3 Problems (from 02/13/18 to present)    Problem Noted  Resolved   Gestational diabetes 09/19/2018 by Malachy Mood, MD No   Antepartum multigravida of advanced maternal age 69/22/2020 by Malachy Mood, MD No   Overview Addendum 03/30/2018  8:34 PM by Malachy Mood, MD           Supervision of high risk pregnancy, antepartum 03/30/2018 by Malachy Mood, MD No   Overview Addendum 09/19/2018  1:17 PM by Malachy Mood, MD    [facility] P[facility]atal Labs  Dating LMP = 7 week [place] Blood type:  O pos  Genetic Screen NIPS: Repeat XX Inheritest: negative  Antibody: negative  Anatomic [place] Normal Rubella: Immune  Varicella: Immune  GTT 144 3-hr 85, 187, 175, 124 RPR:   NR  Rhogam  HBsAg:   neg  TDaP vaccine                       Flu Shot: HIV:   neg  Baby Food                                GBS:   Contraception  Pap: 02/08/2018 NIL HPV negative  CBB     CS/VBAC N/A   Support Person Husband Peterson Ao              Gestational age appropriate obstetric precautions including but not limited to vaginal bleeding, contractions, leaking of fluid and fetal movement were reviewed in detail with the patient.    - BG log reviewed with excellent control on diet.  Based on review of log we discussed reasonable to keep visit 2 weeks but I would like to review her log weekly via MyChart  Return in about 2  weeks (around 10/11/2018) for ROB.  Chelsea AustriaAndreas Cristin Penaflor, MD, Chelsea FrederickFACOG [facility], [facility] 09/28/2018, 9:33 PM

## 2018-10-12 ENCOUNTER — Other Ambulatory Visit: Payer: Self-pay

## 2018-10-12 ENCOUNTER — Ambulatory Visit (INDEPENDENT_AMBULATORY_CARE_PROVIDER_SITE_OTHER): Payer: 59 | Admitting: Obstetrics and Gynecology

## 2018-10-12 VITALS — BP 120/80 | Wt 160.0 lb

## 2018-10-12 DIAGNOSIS — R7309 Other abnormal glucose: Secondary | ICD-10-CM

## 2018-10-12 DIAGNOSIS — O09523 Supervision of elderly multigravida, third trimester: Secondary | ICD-10-CM

## 2018-10-12 DIAGNOSIS — O24410 Gestational diabetes mellitus in pregnancy, diet controlled: Secondary | ICD-10-CM

## 2018-10-12 DIAGNOSIS — O09529 Supervision of elderly multigravida, unspecified trimester: Secondary | ICD-10-CM

## 2018-10-12 DIAGNOSIS — O0990 Supervision of high risk pregnancy, unspecified, unspecified trimester: Secondary | ICD-10-CM

## 2018-10-12 DIAGNOSIS — Z3A34 34 weeks gestation of pregnancy: Secondary | ICD-10-CM

## 2018-10-12 DIAGNOSIS — O0993 Supervision of high risk pregnancy, unspecified, third trimester: Secondary | ICD-10-CM

## 2018-10-12 LAB — POCT URINALYSIS DIPSTICK OB
Glucose, UA: NEGATIVE
POC,PROTEIN,UA: NEGATIVE

## 2018-10-12 NOTE — Progress Notes (Signed)
Routine Prenatal Care Visit  Subjective  Chelsea Sherman is a 38 y.o. [phone] at [redacted]w[redacted]d being seen today for ongoing prenatal care.  She is currently monitored for the following issues for this high-risk pregnancy and has Antepartum multigravida of advanced maternal age; Supervision of high risk pregnancy, antepartum; Abnormal oral glucose tolerance test; and Gestational diabetes on their problem list.  ----------------------------------------------------------------------------------- Patient reports no complaints.   Contractions: Not present. Vag. Bleeding: None.  Movement: Present. Denies leaking of fluid.  ----------------------------------------------------------------------------------- The following portions of the patient's history were reviewed and updated as appropriate: allergies, current medications, past family history, past medical history, past social history, past surgical history and problem list. Problem list updated.   Objective  Blood pressure 120/80, weight 160 lb (72.6 kg), last menstrual period 02/13/2018. Pregravid weight 131 lb (59.4 kg) Total Weight Gain 29 lb (13.2 kg) Urinalysis:      Fetal Status: Fetal Heart Rate (bpm): 140 Fundal Height: 33 cm Movement: Present     General:  Alert, oriented and cooperative. [name] in no acute distress.  Skin: Skin is warm and dry. No rash noted.   Cardiovascular: Normal heart rate noted  Respiratory: Normal respiratory effort, no problems with respiration noted  Abdomen: Soft, gravid, appropriate for gestational age. Pain/Pressure: Absent     Pelvic:  Cervical exam deferred        Extremities: Normal range of motion.     ental Status: Normal mood and affect. Normal behavior. Normal judgment and thought content.     Assessment   38 y.o. [id number] at [redacted]w[redacted]d by  11/20/2018, by Last Menstrual Period presenting for routine prenatal visit  Plan   Pregnancy#3 Problems (from 02/13/18 to present)    Problem Noted  Resolved   Gestational diabetes 09/19/2018 by Malachy Mood, MD No   Antepartum multigravida of advanced maternal age 64/22/2020 by Malachy Mood, MD No   Overview Addendum 03/30/2018  8:34 PM by Malachy Mood, MD           Supervision of high risk pregnancy, antepartum 03/30/2018 by Malachy Mood, MD No   Overview Addendum 09/19/2018  1:17 PM by Malachy Mood, MD    Clinic [facility] Prenatal Labs  Dating LMP = 7 week [place] Blood type:  O pos  Genetic Screen NIPS: Repeat XX Inheritest: negative  Antibody: negative  Anatomic [place] Normal Rubella: Immune  Varicella: Immune  GTT 144 3-hr 85, 187, 175, 124 RPR:   NR  Rhogam  HBsAg:   neg  TDaP vaccine                       Flu Shot: HIV:   neg  Baby Food                                GBS:   Contraception  Pap: 02/08/2018 NIL HPV negative  CBB     CS/VBAC N/A   Support Person Husband Peterson Ao              Gestational age appropriate obstetric precautions including but not limited to vaginal bleeding, contractions, leaking of fluid and fetal movement were reviewed in detail with the patient.    - still excellent BG control on diet  - growth scan next visit  Return in about 2 weeks (around 10/26/2018) for ROB and growth scan.  Malachy Mood, MD, [facility], [facility] 10/12/2018, 4:43  PM

## 2018-10-27 ENCOUNTER — Ambulatory Visit (INDEPENDENT_AMBULATORY_CARE_PROVIDER_SITE_OTHER): Payer: 59 | Admitting: Obstetrics and Gynecology

## 2018-10-27 ENCOUNTER — Other Ambulatory Visit: Payer: Self-pay

## 2018-10-27 ENCOUNTER — Ambulatory Visit (INDEPENDENT_AMBULATORY_CARE_PROVIDER_SITE_OTHER): Payer: 59

## 2018-10-27 ENCOUNTER — Encounter: Payer: Self-pay | Admitting: Obstetrics and Gynecology

## 2018-10-27 VITALS — BP 110/60 | Wt 161.0 lb

## 2018-10-27 DIAGNOSIS — O0990 Supervision of high risk pregnancy, unspecified, unspecified trimester: Secondary | ICD-10-CM

## 2018-10-27 DIAGNOSIS — Z362 Encounter for other antenatal screening follow-up: Secondary | ICD-10-CM

## 2018-10-27 DIAGNOSIS — O09529 Supervision of elderly multigravida, unspecified trimester: Secondary | ICD-10-CM

## 2018-10-27 DIAGNOSIS — O24410 Gestational diabetes mellitus in pregnancy, diet controlled: Secondary | ICD-10-CM

## 2018-10-27 DIAGNOSIS — R7309 Other abnormal glucose: Secondary | ICD-10-CM

## 2018-10-27 DIAGNOSIS — O09523 Supervision of elderly multigravida, third trimester: Secondary | ICD-10-CM

## 2018-10-27 DIAGNOSIS — Z3685 Encounter for antenatal screening for Streptococcus B: Secondary | ICD-10-CM

## 2018-10-27 DIAGNOSIS — O0993 Supervision of high risk pregnancy, unspecified, third trimester: Secondary | ICD-10-CM

## 2018-10-27 DIAGNOSIS — Z3A36 36 weeks gestation of pregnancy: Secondary | ICD-10-CM

## 2018-10-27 LAB — POCT URINALYSIS DIPSTICK OB
Glucose, UA: NEGATIVE
POC,PROTEIN,UA: NEGATIVE

## 2018-10-27 NOTE — Progress Notes (Signed)
ROB Growth scan today GBS today

## 2018-10-27 NOTE — Progress Notes (Signed)
Routine Prenatal Care Visit  Subjective  Chelsea Sherman is a 38 y.o. [phone] at [redacted]w[redacted]d being seen today for ongoing prenatal care.  She is currently monitored for the following issues for this high-risk pregnancy and has Antepartum multigravida of advanced maternal age; Supervision of high risk pregnancy, antepartum; Abnormal oral glucose tolerance test; and Gestational diabetes on their problem list.  ----------------------------------------------------------------------------------- Patient reports no complaints.  Stopped regimen of tea tree oil and coconut oil and has vaginal irritation return.  Also some increase in white to off white discharge. Contractions: Not present. Vag. Bleeding: None.  Movement: Present. Denies leaking of fluid.  ----------------------------------------------------------------------------------- The following portions of the patient's history were reviewed and updated as appropriate: allergies, current medications, past family history, past medical history, past social history, past surgical history and problem list. Problem list updated.   Objective  Blood pressure 110/60, weight 161 lb (73 kg), last menstrual period 02/13/2018. Pregravid weight 131 lb (59.4 kg) Total Weight Gain 30 lb (13.6 kg) Urinalysis:      Fetal Status: Fetal Heart Rate (bpm): 140 Fundal Height: 35 cm Movement: Present  Presentation: Vertex  General:  Alert, oriented and cooperative. [name] in no acute distress.  Skin: Skin is warm and dry. No rash noted.   Cardiovascular: Normal heart rate noted  Respiratory: Normal respiratory effort, no problems with respiration noted  Abdomen: Soft, gravid, appropriate for gestational age. Pain/Pressure: Absent     Pelvic:  Cervical exam performed Dilation: 1.5 Effacement (%): 50 Station: -3  Extremities: Normal range of motion.     ental Status: Normal mood and affect. Normal behavior. Normal judgment and thought content.   US Ob Follow  Up  Result Date: 10/27/2018 Patient Name: Chelsea Sherman DOB: 1980-11-12 MRN: [id number] ULTRASOUND REPORT Location: [facility] Date of Service: 10/27/2018 Indications:growth/afi Findings: Nelda [place] intrauterine pregnancy is visualized with FHR at 165 BPM. Biometrics give an (U/S) Gestational age of [redacted]w[redacted]d and an (U/S) EDD of 11/21/2018; this correlates with the clinically established Estimated Date of Delivery: 11/20/18. Fetal presentation is Cephalic. Placenta: anterior. Grade: 2 AFI: 13.9 cm Growth percentile is 66.8%. EFW: 3189 g  ( 7 lb 0 oz ) Impression: 1. [redacted]w[redacted]d Viable Singleton Intrauterine pregnancy previously established criteria. 2. Growth is 66.8 %ile.  AFI is 13.9 cm. Recommendations: 1.Clinical correlation with the patient's History and Physical Exam. Gweneth Dimitri, RT There is a singleton gestation with normal amniotic fluid volume. The fetal biometry correlates with established dating.  Limited fetal anatomy was performed.The visualized fetal anatomical survey appears within normal limits within the resolution of ultrasound as described above.  It must be noted that a normal ultrasound is unable to rule out fetal aneuploidy.  Malachy Mood, MD, [facility], [facility] 10/27/2018, 12:13 PM     Assessment   38 y.o. [phone] at [redacted]w[redacted]d by  11/20/2018, by Last Menstrual Period presenting for routine prenatal visit  Plan   Pregnancy#3 Problems (from 02/13/18 to present)    Problem Noted Resolved   Gestational diabetes 09/19/2018 by Malachy Mood, MD No   Antepartum multigravida of advanced maternal age 38/22/2020 by Malachy Mood, MD No   Overview Addendum 03/30/2018  8:34 PM by Malachy Mood, MD           Supervision of high risk pregnancy, antepartum 03/30/2018 by Malachy Mood, MD No   Overview Addendum 09/19/2018  1:17 PM by Malachy Mood, MD    [facility]inic [facility] Prenatal Labs  Dating LMP =  7 week US Blood type:  O pos  Genetic Screen  NIPS: Repeat XX Inheritest: negative  Antibody: negative  Anatomic US Normal Rubella: Immune  Varicella: Immune  GTT 144 3-hr 85, 187, 175, 124 RPR:   NR  Rhogam N/A HBsAg:   neg  TDaP vaccine Declined    Flu Shot: HIV:   neg  Baby Food                                GBS: Collected 10/27/2018  Contraception  Pap: 02/08/2018 NIL HPV negative  CBB   Pelvis tested to 7lbs 11oz  CS/VBAC N/A   Support Person Husband Royal HawthornKarl              Gestational age appropriate obstetric precautions including but not limited to vaginal bleeding, contractions, leaking of fluid and fetal movement were reviewed in detail with the patient.    - GBS collected  - BG remain well in range just on diet  Return in about 1 week (around 11/03/2018) for ROB.  Vena AustriaAndreas Mirra Basilio, MD, Evern CoreFACOG [facility], [facility] 10/27/2018, 12:13 PM

## 2018-10-29 LAB — STREP GP B NAA: Strep Gp B NAA: NEGATIVE

## 2018-11-03 ENCOUNTER — Other Ambulatory Visit: Payer: Self-pay

## 2018-11-03 ENCOUNTER — Encounter: Payer: Self-pay | Admitting: Obstetrics and Gynecology

## 2018-11-03 ENCOUNTER — Ambulatory Visit (INDEPENDENT_AMBULATORY_CARE_PROVIDER_SITE_OTHER): Payer: 59 | Admitting: Obstetrics and Gynecology

## 2018-11-03 VITALS — BP 130/80 | Wt 162.0 lb

## 2018-11-03 DIAGNOSIS — O24410 Gestational diabetes mellitus in pregnancy, diet controlled: Secondary | ICD-10-CM

## 2018-11-03 DIAGNOSIS — O09529 Supervision of elderly multigravida, unspecified trimester: Secondary | ICD-10-CM

## 2018-11-03 DIAGNOSIS — O0990 Supervision of high risk pregnancy, unspecified, unspecified trimester: Secondary | ICD-10-CM

## 2018-11-03 DIAGNOSIS — O09523 Supervision of elderly multigravida, third trimester: Secondary | ICD-10-CM

## 2018-11-03 DIAGNOSIS — Z3A37 37 weeks gestation of pregnancy: Secondary | ICD-10-CM

## 2018-11-03 DIAGNOSIS — O0993 Supervision of high risk pregnancy, unspecified, third trimester: Secondary | ICD-10-CM

## 2018-11-03 NOTE — Progress Notes (Signed)
Routine Prenatal Care Visit  Subjective  Chelsea Sherman is a 38 y.o. [phone] at 7219w4d being seen today for ongoing prenatal care.  She is currently monitored for the following issues for this high-risk pregnancy and has Antepartum multigravida of advanced maternal age; Supervision of high risk pregnancy, antepartum; Abnormal oral glucose tolerance test; and Gestational diabetes on their problem list.  ----------------------------------------------------------------------------------- Patient reports continued vulvar itching/irritation, especially after cervical exam last week.  Previously tested with no BV or yeast infection. No change in her discharge.     Contractions: Irregular. Vag. Bleeding: None.  Movement: Present. Leaking Fluid denies.  GDM: BG values nearly all within range ----------------------------------------------------------------------------------- The following portions of the patient's history were reviewed and updated as appropriate: allergies, current medications, past family history, past medical history, past social history, past surgical history and problem list. Problem list updated.  Objective  Blood pressure 130/80, weight 162 lb (73.5 kg), last menstrual period 02/13/2018. Pregravid weight 131 lb (59.4 kg) Total Weight Gain 31 lb (14.1 kg) Urinalysis: Urine Protein    Urine Glucose    Fetal Status: Fetal Heart Rate (bpm): 145 Fundal Height: 37 cm Movement: Present  Presentation: Vertex  General:  Alert, oriented and cooperative. [name] in no acute distress.  Skin: Skin is warm and dry. No rash noted.   Cardiovascular: Normal heart rate noted  Respiratory: Normal respiratory effort, no problems with respiration noted  Abdomen: Soft, gravid, appropriate for gestational age. Pain/Pressure: Absent     Pelvic:  Cervical exam deferred        Extremities: Normal range of motion.     Mental Status: Normal mood and affect. Normal behavior. Normal judgment and  thought content.   Assessment   38 y.o. [id number] at 10219w4d by  11/20/2018, by Last Menstrual Period presenting for routine prenatal visit  Plan   Pregnancy#3 Problems (from 02/13/18 to present)    Problem Noted Resolved   Gestational diabetes 09/19/2018 by Vena AustriaStaebler, Andreas, MD No   Antepartum multigravida of advanced maternal age 49/22/2020 by Vena AustriaStaebler, Andreas, MD No   Overview Addendum 03/30/2018  8:34 PM by Vena AustriaStaebler, Andreas, MD           Supervision of high risk pregnancy, antepartum 03/30/2018 by Vena AustriaStaebler, Andreas, MD No   Overview Addendum 10/27/2018 12:23 PM by Vena AustriaStaebler, Andreas, MD    [facility]inic [facility] Prenatal Labs  Dating LMP = 7 week US Blood type:  O pos  Genetic Screen NIPS: Repeat XX Inheritest: negative  Antibody: negative  Anatomic US Normal Rubella: Immune  Varicella: Immune  GTT 144 3-hr 85, 187, 175, 124 RPR:   NR  Rhogam N/A HBsAg:   neg  TDaP vaccine Declined  Flu Shot: HIV:   neg  Baby Food                                GBS:   Contraception  Pap: 02/08/2018 NIL HPV negative  CBB   Pelvis tested: 7lbs 11oz  CS/VBAC N/A   Support Person Husband Royal HawthornKarl              Term labor symptoms and general obstetric precautions including but not limited to vaginal bleeding, contractions, leaking of fluid and fetal movement were reviewed in detail with the patient. Please refer to After Visit Summary for other counseling recommendations.   - try topical emollient on vulvar area 3 times per day.  Avoid soaps and any other potential  irritants.   Return in about 1 week (around 11/10/2018) for Routine Prenatal Appointment.  Prentice Docker, MD, [facility], [facility] 11/03/2018 12:13 PM

## 2018-11-11 ENCOUNTER — Ambulatory Visit (INDEPENDENT_AMBULATORY_CARE_PROVIDER_SITE_OTHER): Payer: 59 | Admitting: Obstetrics and Gynecology

## 2018-11-11 ENCOUNTER — Other Ambulatory Visit: Payer: Self-pay

## 2018-11-11 VITALS — BP 130/84 | Wt 164.0 lb

## 2018-11-11 DIAGNOSIS — R7309 Other abnormal glucose: Secondary | ICD-10-CM

## 2018-11-11 DIAGNOSIS — O24410 Gestational diabetes mellitus in pregnancy, diet controlled: Secondary | ICD-10-CM

## 2018-11-11 NOTE — Progress Notes (Signed)
    Routine Prenatal Care Visit  Subjective  Chelsea Sherman is a 38 y.o. [phone] at [redacted]w[redacted]d being seen today for ongoing prenatal care.  She is currently monitored for the following issues for this high-risk pregnancy and has Antepartum multigravida of advanced maternal age; Supervision of high risk pregnancy, antepartum; Abnormal oral glucose tolerance test; and Gestational diabetes on their problem list.  ----------------------------------------------------------------------------------- Patient reports no complaints.   Contractions: Irregular. Vag. Bleeding: None.  Movement: Present. Denies leaking of fluid.  ----------------------------------------------------------------------------------- The following portions of the patient's history were reviewed and updated as appropriate: allergies, current medications, past family history, past medical history, past social history, past surgical history and problem list. Problem list updated.   Objective  Blood pressure 130/84, weight 164 lb (74.4 kg), last menstrual period 02/13/2018. Pregravid weight 131 lb (59.4 kg) Total Weight Gain 33 lb (15 kg) Urinalysis:      Fetal Status: Fetal Heart Rate (bpm): 140 Fundal Height: 36 cm Movement: Present  Presentation: Vertex  General:  Alert, oriented and cooperative. [name] in no acute distress.  Skin: Skin is warm and dry. No rash noted.   Cardiovascular: Normal heart rate noted  Respiratory: Normal respiratory effort, no problems with respiration noted  Abdomen: Soft, gravid, appropriate for gestational age. Pain/Pressure: Present     Pelvic:  Cervical exam deferred        Extremities: Normal range of motion.     ental Status: Normal mood and affect. Normal behavior. Normal judgment and thought content.     Assessment   38 y.o. [id number] at [redacted]w[redacted]d by  11/20/2018, by Last Menstrual Period presenting for routine prenatal visit  Plan   Pregnancy#3 Problems (from 02/13/18 to present)    Problem Noted Resolved   Gestational diabetes 09/19/2018 by Malachy Mood, MD No   Antepartum multigravida of advanced maternal age 23/22/2020 by Malachy Mood, MD No   Overview Addendum 03/30/2018  8:34 PM by Malachy Mood, MD           Supervision of high risk pregnancy, antepartum 03/30/2018 by Malachy Mood, MD No   Overview Addendum 10/27/2018 12:23 PM by Malachy Mood, MD    [facility]inic [facility] Prenatal Labs  Dating LMP = 7 week [place] Blood type:  O pos  Genetic Screen NIPS: Repeat XX Inheritest: negative  Antibody: negative  Anatomic [place] Normal Rubella: Immune  Varicella: Immune  GTT 144 3-hr 85, 187, 175, 124 RPR:   NR  Rhogam N/A HBsAg:   neg  TDaP vaccine Declined  Flu Shot: HIV:   neg  Baby Food                                GBS:   Contraception  Pap: 02/08/2018 NIL HPV negative  CBB   Pelvis tested: 7lbs 11oz  CS/VBAC N/A   Support Person Husband Peterson Ao              Gestational age appropriate obstetric precautions including but not limited to vaginal bleeding, contractions, leaking of fluid and fetal movement were reviewed in detail with the patient.    - Deliver timing discussed.  Patient opts for expectant management    Return in about 1 week (around 11/18/2018) for [place].  Malachy Mood, MD, [facility], [facility] 11/11/2018, 11:58 AM

## 2018-11-11 NOTE — Progress Notes (Signed)
ROB

## 2018-11-18 ENCOUNTER — Other Ambulatory Visit: Payer: Self-pay

## 2018-11-18 ENCOUNTER — Ambulatory Visit (INDEPENDENT_AMBULATORY_CARE_PROVIDER_SITE_OTHER): Payer: 59 | Admitting: Obstetrics and Gynecology

## 2018-11-18 VITALS — BP 130/70 | Wt 166.0 lb

## 2018-11-18 DIAGNOSIS — O0990 Supervision of high risk pregnancy, unspecified, unspecified trimester: Secondary | ICD-10-CM

## 2018-11-18 DIAGNOSIS — R7309 Other abnormal glucose: Secondary | ICD-10-CM

## 2018-11-18 DIAGNOSIS — O09523 Supervision of elderly multigravida, third trimester: Secondary | ICD-10-CM

## 2018-11-18 DIAGNOSIS — O24410 Gestational diabetes mellitus in pregnancy, diet controlled: Secondary | ICD-10-CM

## 2018-11-18 DIAGNOSIS — Z3A39 39 weeks gestation of pregnancy: Secondary | ICD-10-CM

## 2018-11-18 DIAGNOSIS — O09529 Supervision of elderly multigravida, unspecified trimester: Secondary | ICD-10-CM

## 2018-11-18 NOTE — Progress Notes (Signed)
Routine Prenatal Care Visit  Subjective  Chelsea Sherman is a 38 y.o. [phone] at 110w5d being seen today for ongoing prenatal care.  She is currently monitored for the following issues for this high-risk pregnancy and has Antepartum multigravida of advanced maternal age; Supervision of high risk pregnancy, antepartum; Abnormal oral glucose tolerance test; and Gestational diabetes on their problem list.  ----------------------------------------------------------------------------------- Patient reports no complaints.   Contractions: Irregular. Vag. Bleeding: None.  Movement: Present. Denies leaking of fluid.  ----------------------------------------------------------------------------------- The following portions of the patient's history were reviewed and updated as appropriate: allergies, current medications, past family history, past medical history, past social history, past surgical history and problem list. Problem list updated.   Objective  Blood pressure 130/70, weight 166 lb (75.3 kg), last menstrual period 02/13/2018. Pregravid weight 131 lb (59.4 kg) Total Weight Gain 35 lb (15.9 kg) Urinalysis:      Fetal Status: Fetal Heart Rate (bpm): 145 Fundal Height: 38 cm Movement: Present  Presentation: Vertex  General:  Alert, oriented and cooperative. [name] in no acute distress.  Skin: Skin is warm and dry. No rash noted.   Cardiovascular: Normal heart rate noted  Respiratory: Normal respiratory effort, no problems with respiration noted  Abdomen: Soft, gravid, appropriate for gestational age. Pain/Pressure: Present     Pelvic:  Cervical exam performed Dilation: 2.5 Effacement (%): 70 Station: -3  Extremities: Normal range of motion.     ental Status: Normal mood and affect. Normal behavior. Normal judgment and thought content.     Assessment   38 y.o. [id number] at [redacted]w[redacted]d by  11/20/2018, by Last Menstrual Period presenting for routine prenatal visit  Plan   Pregnancy#3  Problems (from 02/13/18 to present)    Problem Noted Resolved   Gestational diabetes 09/19/2018 by Malachy Mood, MD No   Overview Signed 11/11/2018 11:57 AM by Malachy Mood, MD    Diet controlled Growth 36 weeks 7lbs 0oz      Antepartum multigravida of advanced maternal age 87/22/2020 by Malachy Mood, MD No   Overview Addendum 03/30/2018  8:34 PM by Malachy Mood, MD           Supervision of high risk pregnancy, antepartum 03/30/2018 by Malachy Mood, MD No   Overview Addendum 10/27/2018 12:23 PM by Malachy Mood, MD    [facility]inic [facility] Prenatal Labs  Dating LMP = 7 week [place] Blood type:  O pos  Genetic Screen NIPS: Repeat XX Inheritest: negative  Antibody: negative  Anatomic [place] Normal Rubella: Immune  Varicella: Immune  GTT 144 3-hr 85, 187, 175, 124 RPR:   NR  Rhogam N/A HBsAg:   neg  TDaP vaccine Declined  Flu Shot: HIV:   neg  Baby Food                                GBS:   Contraception  Pap: 02/08/2018 NIL HPV negative  CBB   Pelvis tested: 7lbs 11oz  CS/VBAC N/A   Support Person Husband Peterson Ao              Gestational age appropriate obstetric precautions including but not limited to vaginal bleeding, contractions, leaking of fluid and fetal movement were reviewed in detail with the patient.    - Diet controlled GDM - IOL 10/27/18  Return in about 1 week (around 11/25/2018) for OB.  Malachy Mood, MD, [facility], [facility] 11/18/2018, 10:41 AM

## 2018-11-18 NOTE — Progress Notes (Signed)
ROB Declined Flu vaccine

## 2018-11-19 ENCOUNTER — Inpatient Hospital Stay
Admission: EM | Admit: 2018-11-19 | Discharge: 2018-11-20 | DRG: 807 | Disposition: A | Discharge status: Home / Self Care | Payer: 59 | Attending: Certified Nurse Midwife | Admitting: Certified Nurse Midwife

## 2018-11-19 ENCOUNTER — Other Ambulatory Visit: Payer: Self-pay

## 2018-11-19 ENCOUNTER — Encounter: Payer: Self-pay | Admitting: Certified Nurse Midwife

## 2018-11-19 DIAGNOSIS — Z3A39 39 weeks gestation of pregnancy: Secondary | ICD-10-CM

## 2018-11-19 DIAGNOSIS — Z20828 Contact with and (suspected) exposure to other viral communicable diseases: Secondary | ICD-10-CM | POA: Diagnosis present

## 2018-11-19 DIAGNOSIS — O09529 Supervision of elderly multigravida, unspecified trimester: Secondary | ICD-10-CM

## 2018-11-19 DIAGNOSIS — O24420 Gestational diabetes mellitus in childbirth, diet controlled: Secondary | ICD-10-CM | POA: Diagnosis present

## 2018-11-19 DIAGNOSIS — O24410 Gestational diabetes mellitus in pregnancy, diet controlled: Secondary | ICD-10-CM

## 2018-11-19 DIAGNOSIS — O43123 Velamentous insertion of umbilical cord, third trimester: Secondary | ICD-10-CM | POA: Diagnosis present

## 2018-11-19 DIAGNOSIS — O26893 Other specified pregnancy related conditions, third trimester: Secondary | ICD-10-CM | POA: Diagnosis present

## 2018-11-19 DIAGNOSIS — O0990 Supervision of high risk pregnancy, unspecified, unspecified trimester: Secondary | ICD-10-CM

## 2018-11-19 HISTORY — DX: Gestational diabetes mellitus in pregnancy, unspecified control: O24.419

## 2018-11-19 LAB — GLUCOSE, CAPILLARY: Glucose-Capillary: 107 mg/dL — ABNORMAL HIGH (ref 70–99)

## 2018-11-19 LAB — CBC
HCT: 38.4 % (ref 36.0–46.0)
Hemoglobin: 12.7 g/dL (ref 12.0–15.0)
MCH: 27.7 pg (ref 26.0–34.0)
MCHC: 33.1 g/dL (ref 30.0–36.0)
MCV: 83.8 fL (ref 80.0–100.0)
Platelets: 227 10*3/uL (ref 150–400)
RBC: 4.58 MIL/uL (ref 3.87–5.11)
RDW: 13.6 % (ref 11.5–15.5)
WBC: 10.4 10*3/uL (ref 4.0–10.5)
nRBC: 0 % (ref 0.0–0.2)

## 2018-11-19 LAB — SARS CORONAVIRUS 2 BY RT PCR (HOSPITAL ORDER, PERFORMED IN ~~LOC~~ HOSPITAL LAB): SARS Coronavirus 2: NEGATIVE

## 2018-11-19 LAB — TYPE AND SCREEN
ABO/RH(D): O POS
Antibody Screen: NEGATIVE

## 2018-11-19 MED ORDER — BENZOCAINE-MENTHOL 20-0.5 % EX AERO
1.0000 "application " | INHALATION_SPRAY | CUTANEOUS | Status: DC | PRN
  Administered 2018-11-20: 1 via TOPICAL
  Filled 2018-11-19: qty 56

## 2018-11-19 MED ORDER — FERROUS SULFATE 325 (65 FE) MG PO TABS
325.0000 mg | ORAL_TABLET | Freq: Every day | ORAL | Status: DC
  Administered 2018-11-20: 325 mg via ORAL
  Filled 2018-11-19: qty 1

## 2018-11-19 MED ORDER — WITCH HAZEL-GLYCERIN EX PADS
1.0000 "application " | MEDICATED_PAD | CUTANEOUS | Status: DC | PRN
  Filled 2018-11-19: qty 100

## 2018-11-19 MED ORDER — DIBUCAINE (PERIANAL) 1 % EX OINT: 1.0000 "application " | TOPICAL_OINTMENT | CUTANEOUS | Status: DC | PRN

## 2018-11-19 MED ORDER — SIMETHICONE 80 MG PO CHEW: 80.0000 mg | CHEWABLE_TABLET | ORAL | Status: DC | PRN

## 2018-11-19 MED ORDER — IBUPROFEN 600 MG PO TABS
ORAL_TABLET | ORAL | Status: AC
  Filled 2018-11-19: qty 1

## 2018-11-19 MED ORDER — OXYCODONE HCL 5 MG PO TABS: 5.0000 mg | ORAL_TABLET | ORAL | Status: DC | PRN

## 2018-11-19 MED ORDER — ONDANSETRON HCL 4 MG/2ML IJ SOLN: 4.0000 mg | INTRAMUSCULAR | Status: DC | PRN

## 2018-11-19 MED ORDER — OXYMETAZOLINE HCL 0.05 % NA SOLN
1.0000 | Freq: Two times a day (BID) | NASAL | Status: DC
  Filled 2018-11-19: qty 15

## 2018-11-19 MED ORDER — BENZOCAINE-MENTHOL 20-0.5 % EX AERO
INHALATION_SPRAY | CUTANEOUS | Status: AC
  Filled 2018-11-19: qty 56

## 2018-11-19 MED ORDER — MISOPROSTOL 200 MCG PO TABS
ORAL_TABLET | ORAL | Status: AC
  Filled 2018-11-19: qty 4

## 2018-11-19 MED ORDER — OXYTOCIN BOLUS FROM INFUSION
500.0000 mL | Freq: Once | INTRAVENOUS | Status: AC
  Administered 2018-11-19: 500 mL via INTRAVENOUS

## 2018-11-19 MED ORDER — AMMONIA AROMATIC IN INHA
RESPIRATORY_TRACT | Status: AC
  Filled 2018-11-19: qty 10

## 2018-11-19 MED ORDER — ONDANSETRON HCL 4 MG PO TABS: 4.0000 mg | ORAL_TABLET | ORAL | Status: DC | PRN

## 2018-11-19 MED ORDER — LIDOCAINE HCL (PF) 1 % IJ SOLN
INTRAMUSCULAR | Status: AC
  Administered 2018-11-19: 30 mL via SUBCUTANEOUS
  Filled 2018-11-19: qty 30

## 2018-11-19 MED ORDER — OXYTOCIN 40 UNITS IN NORMAL SALINE INFUSION - SIMPLE MED
2.5000 [IU]/h | INTRAVENOUS | Status: DC
  Filled 2018-11-19: qty 1000

## 2018-11-19 MED ORDER — SENNOSIDES-DOCUSATE SODIUM 8.6-50 MG PO TABS
2.0000 | ORAL_TABLET | ORAL | Status: DC
  Administered 2018-11-20: 2 via ORAL
  Filled 2018-11-19: qty 2

## 2018-11-19 MED ORDER — AMMONIA AROMATIC IN INHA: 0.3000 mL | Freq: Once | RESPIRATORY_TRACT | Status: DC | PRN

## 2018-11-19 MED ORDER — PRENATAL MULTIVITAMIN CH
1.0000 | ORAL_TABLET | Freq: Every day | ORAL | Status: DC
  Administered 2018-11-20: 1 via ORAL
  Filled 2018-11-19: qty 1

## 2018-11-19 MED ORDER — LACTATED RINGERS IV SOLN
INTRAVENOUS | Status: DC
  Administered 2018-11-19: 16:00:00 via INTRAVENOUS

## 2018-11-19 MED ORDER — OXYTOCIN 10 UNIT/ML IJ SOLN
INTRAMUSCULAR | Status: AC
  Filled 2018-11-19: qty 2

## 2018-11-19 MED ORDER — IBUPROFEN 600 MG PO TABS
600.0000 mg | ORAL_TABLET | Freq: Four times a day (QID) | ORAL | Status: DC
  Administered 2018-11-19 – 2018-11-20 (×4): 600 mg via ORAL
  Filled 2018-11-19 (×3): qty 1

## 2018-11-19 MED ORDER — LACTATED RINGERS IV SOLN: 500.0000 mL | INTRAVENOUS | Status: DC | PRN

## 2018-11-19 MED ORDER — ACETAMINOPHEN 325 MG PO TABS
650.0000 mg | ORAL_TABLET | ORAL | Status: DC | PRN
  Filled 2018-11-19: qty 2

## 2018-11-19 MED ORDER — COCONUT OIL OIL
1.0000 "application " | TOPICAL_OIL | Status: DC | PRN
  Administered 2018-11-20: 1 via TOPICAL
  Filled 2018-11-19: qty 120

## 2018-11-19 MED ORDER — MISOPROSTOL 200 MCG PO TABS: 800.0000 ug | ORAL_TABLET | Freq: Once | ORAL | Status: DC | PRN

## 2018-11-19 MED ORDER — LIDOCAINE HCL (PF) 1 % IJ SOLN
30.0000 mL | INTRAMUSCULAR | Status: AC | PRN
  Administered 2018-11-19: 18:00:00 30 mL via SUBCUTANEOUS

## 2018-11-19 NOTE — OB Triage Note (Signed)
Patient here for contractions that began this morning but became stronger and closer together around 2pm.  No LOF or bloody discharge reported.

## 2018-11-19 NOTE — Plan of Care (Signed)
Patient admitted for labor and delivered at [phone]

## 2018-11-19 NOTE — Discharge Summary (Signed)
Physician Obstetric Discharge Summary  Patient ID: Chelsea Sherman MRN: [id number] DOB/AGE: Sep 26, 1980 38 y.o.   Date of Admission: 11/19/2018 Date of Delivery: 11/19/2018 Delivering Provider: Dalia Heading, CNM Date of Discharge: 11/20/2018  Admitting Diagnosis: Onset of Labor at [redacted]w[redacted]d Secondary Diagnosis: Gestational diabetes diet controlled (A1)  Mode of Delivery: normal spontaneous vaginal delivery 11/19/2018      Discharge Diagnosis: Term intrauterine pregnancy-delivered   Intrapartum Procedures: Atificial rupture of membranes, repair of obstetric lacerations   Post partum procedures: none  Complications: none   Brief Hospital Course  Chelsea Sherman a [id number] had a SVD on 11/19/2018;  for further details of this delivery, please refer to the delivery note.  Patient had an uncomplicated postpartum course.  By time of discharge on PPD#1, her pain was controlled on oral pain medications; she had appropriate lochia and was ambulating, voiding without difficulty and tolerating regular diet.  She was deemed stable for discharge to home.    Labs: CBC Latest Ref Rng & Units 11/20/2018 11/19/2018 08/29/2018  WBC 4.0 - 10.5 K/uL 12.0(H) 10.4 9.3  Hemoglobin 12.0 - 15.0 g/dL 11.8(L) 12.7 12.5  Hematocrit 36.0 - 46.0 % 36.3 38.4 38.4  Platelets 150 - 400 K/uL 180 227 219   O POS/ RI/ VI  Physical exam:  Blood pressure 118/74, pulse 82, temperature 98.1 F (36.7 C), temperature source Oral, resp. rate 18, height '5\' 7"'  (1.702 m), weight 75.3 kg, last menstrual period 02/13/2018, SpO2 97 %, unknown if currently breastfeeding. General: alert and no distress Lochia: appropriate Abdomen: soft, NT Uterine Fundus: firm Vulvar lacerations healing well, no significant drainage, no dehiscence, no significant erythema or edema Extremities: No evidence of DVT seen on physical exam. No lower extremity edema.  Discharge Instructions: Per After Visit Summary. Activity: Advance as  tolerated. Pelvic rest for 6 weeks.  Also refer to Discharge Instructions Diet: Regular Medications: Allergies as of 11/20/2018   No Known Allergies     Medication List    STOP taking these medications   Accu-Chek FastClix Lancets Misc   OneTouch Verio w/Device Kit     TAKE these medications   acetaminophen 325 MG tablet Commonly known as: Tylenol Take 2 tablets (650 mg total) by mouth every 4 (four) hours as needed for mild pain, moderate pain or headache (for pain scale < 4).   ibuprofen 600 MG tablet Commonly known as: ADVIL Take 1 tablet (600 mg total) by mouth every 6 (six) hours as needed for mild pain, moderate pain or cramping.   multivitamin-prenatal 27-0.8 MG Tabs tablet Take 1 tablet by mouth daily at 12 noon.   OneTouch Verio test strip Generic drug: glucose blood Use as instructed   PROBIOTIC ACIDOPHILUS PO Take 1 tablet by mouth daily.      Outpatient follow up:  Follow-up Information    GDalia Heading CNM. Schedule an appointment as soon as possible for a visit in 6 week(s).   Specialty: Certified Nurse Midwife Why: Please call to schedule your 6 week postpartum follow up appointment with CDalia HeadingYou can also schedule a 2 hour GTT on the same day. You will need to be fasting for this test. Contact information: [phone]         Postpartum contraception: condoms  Discharged Condition: good  Discharged to: home   Newborn Data: Chelsea Sherman 7#10 oz Disposition:home with mother  Apgars: APGAR (1 MIN): 8   APGAR (5 MINS): 9   APGAR (  10 MINS):    Baby Feeding: Breast  Dalia Heading, [place] 11/20/2018 5:20 PM

## 2018-11-19 NOTE — H&P (Signed)
OB History & Physical   History of Present Illness:  Chief Complaint:  Complains of regular contractions since 1245 today.  HPI:  Chelsea Sherman is a 38 y.o. G4P2103 female with EDC=11/20/2018 at [redacted]w[redacted]d dated by LMP=7wk4d US..  Her pregnancy has been complicated by AMA, and diet controlled GDM.. A 36 week ultrasound EFW was 7#. Pelvis is proven to 7#11oz.  She presents to L&D for evaluation of labor.. She began having more regular contractions at 1245 this afternoon that progressively became more intense.   On arrival to L&D, her cervix was dilated to 7cm.   Prenatal care site: Prenatal care at [facility] has been remarkable for  [facility] Prenatal Labs  Dating LMP = 7 week US Blood type:  O pos  Genetic Screen NIPS: Repeat XX Inheritest: negative  Antibody: negative  Anatomic US Normal Rubella: Immune  Varicella: Immune  GTT 144 3-hr 85, 187, 175, 124 RPR:   NR  Rhogam N/A HBsAg:   neg  TDaP vaccine Declined  Flu Shot: HIV:   neg  Baby Food         Breast                       GBS: negative  Contraception condoms Pap: 02/08/2018 NIL HPV negative  CBB   Pelvis tested: 7lbs 11oz  CS/VBAC N/A   Support Person Husband Karl    OB History  Gravida Para Term Preterm AB Living  4 3 2 1   3  SAB TAB Ectopic Multiple Live Births        0 3    # Outcome Date GA Lbr Len/2nd Weight Sex Delivery Anes PTL Lv  4 Current           3 Preterm 10/11/14 [redacted]w[redacted]d 15:41 / 00:20 2523 g M Vag-Spont EPI  LIV  2 Term 06/08/12 [redacted]w[redacted]d  3033 g F Vag-Spont   LIV  1 Term 02/07/10 [redacted]w[redacted]d  3487 g M Vag-Spont   LIV       Maternal Medical History:   Past Medical History:  Diagnosis Date  . Pap smear abnormality of cervix 02/2014   ASCUS    Past Surgical History:  Procedure Laterality Date  . WISDOM TOOTH EXTRACTION      No Known Allergies  Prior to Admission medications   Medication Sig Start Date End Date Taking? Authorizing Provider  Accu-Chek FastClix Lancets MISC 1 Units by  Percutaneous route 4 (four) times daily. 09/19/18  Yes Staebler, Andreas, MD  Blood Glucose Monitoring Suppl (ONETOUCH VERIO) w/Device KIT 1 kit by Does not apply route as directed. 09/19/18  Yes Staebler, Andreas, MD  glucose blood (ONETOUCH VERIO) test strip Use as instructed 09/19/18  Yes Staebler, Andreas, MD  Lactobacillus (PROBIOTIC ACIDOPHILUS PO) Take 1 tablet by mouth daily.   Yes [provider]  Prenatal Vit-Fe Fumarate-FA (MULTIVITAMIN-PRENATAL) 27-0.8 MG TABS tablet Take 1 tablet by mouth daily at 12 noon.   Yes [provider]      Social History: She  reports that she has never smoked. She has never used smokeless tobacco. She reports that she does not drink alcohol or use drugs.  Family History: family history includes Cancer in her paternal grandmother; Diabetes in her mother; Heart disease in her maternal grandfather and paternal grandfather.   Review of Systems: Negative x 10 systems reviewed except as noted in the HPI.      Physical Exam:  Vital Signs: 129/68   Pulse 79 Initial CBG was 107  General: gravid WF, breathing and moaning in pain with contractions. HEENT: normocephalic, atraumatic Heart: regular rate & rhythm.  No murmurs/rubs/gallops Lungs: clear to auscultation bilaterally Abdomen: soft, gravid, non-tender;  EFW: 81/2# Pelvic:   External: Normal external female genitalia  Cervix:7/ 80%/-1 to 0  Extremities: non-tender, symmetric, trace edema bilaterally.  Many varicose veins lower extremities. DTRs: +3/+3  Neurologic: Alert & oriented x 3.    Baseline FHR: 135-140 baseline with accelerations to 160s, moderate variability Toco: contractions every 2-3 minutes apart Assessment:  Chelsea Sherman is a 38 y.o. G4P2103 female at [redacted]w[redacted]d presents in active labor GDMA1 FWB: Cat 1 Plan:  1. Admit to Labor & Delivery - anticipate vaginal delivery soon . Continue to monitor progress and  fetal maternal well being. 2. CBC, T&S, Clrs, IVF, Covid  testing 3. GBS negative.   4. Consents obtained. 5. Reviewed birth plan 6. Desires epidural if time permits. 7. O POS/ RI/ VI 8. Declined flu vaccine and TDAP. 9. Breast 10. Condoms for contraception 11. CBG q2 hours   Colleen Gutierrez , CNM 11/19/2018 4:36 PM    

## 2018-11-20 ENCOUNTER — Encounter: Payer: Self-pay | Admitting: Certified Nurse Midwife

## 2018-11-20 LAB — GLUCOSE, CAPILLARY: Glucose-Capillary: 87 mg/dL (ref 70–99)

## 2018-11-20 LAB — CBC
HCT: 36.3 % (ref 36.0–46.0)
Hemoglobin: 11.8 g/dL — ABNORMAL LOW (ref 12.0–15.0)
MCH: 27.8 pg (ref 26.0–34.0)
MCHC: 32.5 g/dL (ref 30.0–36.0)
MCV: 85.4 fL (ref 80.0–100.0)
Platelets: 180 10*3/uL (ref 150–400)
RBC: 4.25 MIL/uL (ref 3.87–5.11)
RDW: 13.7 % (ref 11.5–15.5)
WBC: 12 10*3/uL — ABNORMAL HIGH (ref 4.0–10.5)
nRBC: 0 % (ref 0.0–0.2)

## 2018-11-20 LAB — RPR: RPR Ser Ql: NONREACTIVE

## 2018-11-20 MED ORDER — ACETAMINOPHEN 325 MG PO TABS: 650.0000 mg | ORAL_TABLET | ORAL | Status: AC | PRN

## 2018-11-20 MED ORDER — IBUPROFEN 600 MG PO TABS: 600.0000 mg | ORAL_TABLET | Freq: Four times a day (QID) | ORAL | 0 refills | Status: AC | PRN

## 2018-11-20 NOTE — Progress Notes (Signed)
Post Partum Day 1 Subjective: up ad lib, voiding, tolerating PO and some cramping this Am that resolved. Baby breast feeding well.  Objective: Blood pressure 104/64, pulse 65, temperature 98.6 F (37 C), temperature source Oral, resp. rate 18, height 5\' 7"  (1.702 m), weight 75.3 kg, last menstrual period 02/13/2018, SpO2 97 %, unknown if currently breastfeeding.  Physical Exam:  General: alert, cooperative and no distress Lochia: appropriate Uterine Fundus: firm/ U/ NT Perineum: healing well, some swelling near vaginal opening. Labial laceration intact, no swelling. DVT Evaluation: No evidence of DVT seen on physical exam.  Recent Labs    11/19/18 1620 11/20/18 0657  HGB 12.7 11.8*  HCT 38.4 36.3  WBC 10.4 12.0*  PLT 227 180    Assessment/Plan: Stable PPD #1-continue postpartum care  Possible discharge later today if baby discharged. O POS/ RI/ VI TDAP-declined Breast Condoms  Dalia Heading, CNM   LOS: 1 day   Dalia Heading 11/20/2018, 9:25 AM

## 2018-11-20 NOTE — Progress Notes (Signed)
Discharge order received from doctor. Reviewed discharge instructions and prescriptions with patient and answered all questions. Follow up appointment instructions given. Patient verbalized understanding. ID bands checked. Patient discharged home with infant via wheelchair by nursing/auxillary.    Kerri Asche Garner, RN  

## 2018-11-20 NOTE — Discharge Instructions (Signed)

## 2018-11-25 ENCOUNTER — Encounter: Payer: 59 | Admitting: Obstetrics and Gynecology

## 2018-11-29 ENCOUNTER — Telehealth: Payer: Self-pay

## 2018-11-29 NOTE — Telephone Encounter (Signed)
Pt called triage reporting she missed AMS call.

## 2018-12-27 ENCOUNTER — Other Ambulatory Visit: Payer: Self-pay

## 2018-12-27 ENCOUNTER — Other Ambulatory Visit: Payer: Self-pay | Admitting: Certified Nurse Midwife

## 2018-12-27 ENCOUNTER — Other Ambulatory Visit: Payer: 59

## 2018-12-27 ENCOUNTER — Ambulatory Visit (INDEPENDENT_AMBULATORY_CARE_PROVIDER_SITE_OTHER): Payer: 59 | Admitting: Obstetrics and Gynecology

## 2018-12-27 ENCOUNTER — Encounter: Payer: Self-pay | Admitting: Obstetrics and Gynecology

## 2018-12-27 DIAGNOSIS — Z1389 Encounter for screening for other disorder: Secondary | ICD-10-CM | POA: Diagnosis not present

## 2018-12-27 DIAGNOSIS — O24410 Gestational diabetes mellitus in pregnancy, diet controlled: Secondary | ICD-10-CM

## 2018-12-27 DIAGNOSIS — O24429 Gestational diabetes mellitus in childbirth, unspecified control: Secondary | ICD-10-CM

## 2018-12-27 DIAGNOSIS — O09529 Supervision of elderly multigravida, unspecified trimester: Secondary | ICD-10-CM

## 2018-12-27 DIAGNOSIS — O0990 Supervision of high risk pregnancy, unspecified, unspecified trimester: Secondary | ICD-10-CM

## 2018-12-27 MED ORDER — TERCONAZOLE 0.4 % VA CREA: 1.0000 | TOPICAL_CREAM | Freq: Every day | VAGINAL | 1 refills | Status: DC

## 2018-12-27 NOTE — Progress Notes (Signed)
Postpartum Visit  Chief Complaint:  Chief Complaint  Patient presents with  . Postpartum Care    2 hour GTT    History of Present Illness: [name] a 38 y.o. [id number] presents for postpartum visit.  Date of delivery: 11/19/2018 Type of delivery: Vaginal delivery - Vacuum or forceps assisted  no Episiotomy No.  Laceration: first degree Pregnancy or labor problems:  Yes GDM diet controlled Any problems since the delivery:  no  Newborn Details:  SINGLETON (Lily) :  1. BabyGender female. Birth weight: 7lbs 10oz Maternal Details:  Breast or formula feeding: plans to breastfeed Intercourse: No  Contraception after delivery: No  Any bowel or bladder issues: No  Post partum depression/anxiety noted:  no Edinburgh Post-Partum Depression Score:0 Date of last PAP: 02/08/2018  NIL and HR HPV negative   Review of Systems: Review of Systems  Constitutional: Negative.   Gastrointestinal: Negative.   Genitourinary: Negative.   Psychiatric/Behavioral: Negative.    The following portions of the patient's history were reviewed and updated as appropriate: allergies, current medications, past family history, past medical history, past social history, past surgical history and problem list.  Past Medical History:  Past Medical History:  Diagnosis Date  . Gestational diabetes   . Pap smear abnormality of cervix 02/2014   ASCUS    Past Surgical History:  Past Surgical History:  Procedure Laterality Date  . WISDOM TOOTH EXTRACTION      Family History:  Family History  Problem Relation Age of Onset  . Diabetes Mother   . Heart disease Maternal Grandfather   . Cancer Paternal Grandmother   . Heart disease Paternal Grandfather     Social History:  Social History   Socioeconomic History  . Marital status: Married    Spouse name: Not on file  . Number of children: Not on file  . Years of education: Not on file  . Highest education level: Not on file  Occupational  History  . Not on file  Social Needs  . Financial resource strain: Not on file  . Food insecurity    Worry: Not on file    Inability: Not on file  . Transportation needs    Medical: Not on file    Non-medical: Not on file  Tobacco Use  . Smoking status: Never Smoker  . Smokeless tobacco: Never Used  Substance and Sexual Activity  . Alcohol use: No  . Drug use: No  . Sexual activity: Yes    Birth control/protection: Condom  Lifestyle  . Physical activity    Days per week: Not on file    Minutes per session: Not on file  . Stress: Not on file  Relationships  . Social Herbalist on phone: Not on file    Gets together: Not on file    Attends religious service: Not on file    Active member of club or organization: Not on file    Attends meetings of clubs or organizations: Not on file    Relationship status: Not on file  . Intimate partner violence    Fear of current or ex partner: Not on file    Emotionally abused: Not on file    Physically abused: Not on file    Forced sexual activity: Not on file  Other Topics Concern  . Not on file  Social History Narrative  . Not on file    Allergies:  No Known Allergies  Medications: Prior to Admission medications  Medication Sig Start Date End Date Taking? Authorizing Provider  acetaminophen (TYLENOL) 325 MG tablet Take 2 tablets (650 mg total) by mouth every 4 (four) hours as needed for mild pain, moderate pain or headache (for pain scale < 4). 11/20/18  Yes Farrel Conners, CNM  ibuprofen (ADVIL) 600 MG tablet Take 1 tablet (600 mg total) by mouth every 6 (six) hours as needed for mild pain, moderate pain or cramping. 11/20/18  Yes Farrel Conners, CNM  Lactobacillus (PROBIOTIC ACIDOPHILUS PO) Take 1 tablet by mouth daily.   Yes [provider]  Prenatal Vit-Fe Fumarate-FA (MULTIVITAMIN-PRENATAL) 27-0.8 MG TABS tablet Take 1 tablet by mouth daily at 12 noon.   Yes [provider]    Physical  Exam Blood pressure 138/78, pulse 99, weight 143 lb (64.9 kg), currently breastfeeding.    General: NAD HEENT: normocephalic, anicteric Pulmonary: No increased work of breathing Abdomen: NABS, soft, non-tender, non-distended.  Umbilicus without lesions.  No hepatomegaly, splenomegaly or masses palpable. No evidence of hernia. Genitourinary:  External: Normal external female genitalia.  Normal urethral meatus, normal  Bartholin's and Skene's glands.    Vagina: Normal vaginal mucosa, no evidence of prolapse.    Cervix: Grossly normal in appearance, no bleeding  Uterus: Non-enlarged, mobile, normal contour.  No CMT  Adnexa: ovaries non-enlarged, no adnexal masses  Rectal: deferred Extremities: no edema, erythema, or tenderness Neurologic: Grossly intact Psychiatric: mood appropriate, affect full    Edinburgh Postnatal Depression Scale - 12/27/18 1057      Edinburgh Postnatal Depression Scale:  In the Past 7 Days   I have been able to laugh and see the funny side of things.  0    I have looked forward with enjoyment to things.  0    I have blamed myself unnecessarily when things went wrong.  0    I have been anxious or worried for no good reason.  0    I have felt scared or panicky for no good reason.  0    Things have been getting on top of me.  0    I have been so unhappy that I have had difficulty sleeping.  0    I have felt sad or miserable.  0    I have been so unhappy that I have been crying.  0    The thought of harming myself has occurred to me.  0    Edinburgh Postnatal Depression Scale Total  0      Wet Prep: PH: 4.5 Clue Cells: Negative Fungal elements: Negative Trichomonas: Negative  Assessment: Chelsea y.o. [id number] presenting for 6 week postpartum visit  Plan: Problem List Items Addressed This Visit    None    Visit Diagnoses    6 weeks postpartum follow-up    -  Primary       1) Contraception - Education given regarding options for contraception, as well as  compatibility with breast feeding if applicable.  Patient plans on vasectomy for contraception.  2)  Pap - ASCCP guidelines and rational discussed.  ASCCP guidelines and rational discussed.  Patient opts for every 3 years screening interval  3) Patient underwent screening for postpartum depression with no signs of depression  4) Vaginal irritation/itching - negative wet prep, will trial terzol   5) Return in about 1 year (around 12/27/2019) for annual.   Vena Austria, MD, [facility], [facility] 12/27/2018, 10:55 AM

## 2018-12-28 LAB — GLUCOSE TOLERANCE, 2 HOURS
Glucose, 2 hour: 75 mg/dL (ref 65–139)
Glucose, GTT - Fasting: 84 mg/dL (ref 65–99)

## 2020-02-16 ENCOUNTER — Other Ambulatory Visit (HOSPITAL_COMMUNITY)
Admission: RE | Admit: 2020-02-16 | Discharge: 2020-02-16 | Disposition: A | Discharge status: Home / Self Care | Payer: 59 | Source: Ambulatory Visit | Attending: Obstetrics and Gynecology | Admitting: Obstetrics and Gynecology

## 2020-02-16 ENCOUNTER — Other Ambulatory Visit: Payer: Self-pay

## 2020-02-16 ENCOUNTER — Encounter: Payer: Self-pay | Admitting: Obstetrics and Gynecology

## 2020-02-16 ENCOUNTER — Ambulatory Visit (INDEPENDENT_AMBULATORY_CARE_PROVIDER_SITE_OTHER): Payer: 59 | Admitting: Obstetrics and Gynecology

## 2020-02-16 VITALS — BP 110/66 | Ht 67.0 in | Wt 138.0 lb

## 2020-02-16 DIAGNOSIS — Z124 Encounter for screening for malignant neoplasm of cervix: Secondary | ICD-10-CM

## 2020-02-16 DIAGNOSIS — Z01419 Encounter for gynecological examination (general) (routine) without abnormal findings: Secondary | ICD-10-CM

## 2020-02-16 DIAGNOSIS — R0789 Other chest pain: Secondary | ICD-10-CM

## 2020-02-16 DIAGNOSIS — Z1239 Encounter for other screening for malignant neoplasm of breast: Secondary | ICD-10-CM | POA: Diagnosis not present

## 2020-02-16 NOTE — Progress Notes (Signed)
Gynecology Annual Exam   PCP: Rolm Gala, MD  Chief Complaint:  Chief Complaint  Patient presents with  . Gynecologic Exam    Annual - No concerns. RM 5    History of Present Illness: [name] a 39 y.o. [id number] presents for annual exam. The patient has no complaints today.   LMP: Patient's last menstrual period was 02/03/2020. No menstrual concerns  The patient is sexually active. She currently uses vasectomy for contraception. She denies dyspareunia.  The patient does not perform self breast exams.  There is no notable family history of breast or ovarian cancer in her family.  The patient wears seatbelts: yes.   The patient has regular exercise: not asked.    The patient denies current symptoms of depression.    Some atypical chest pain, sternum and right side.  Unrelated to po intake or exertion.  No palpitations.  Has had off an on for several years.  Dad just had open heart surgery for bicuspid aortic valve.  No diaphoresis.  Is worse with inspiration and usually self limited.    Review of Systems: Review of Systems  Constitutional: Negative for chills and fever.  HENT: Negative for congestion.   Respiratory: Negative for cough and shortness of breath.   Cardiovascular: Negative for chest pain and palpitations.  Gastrointestinal: Negative for abdominal pain, constipation, diarrhea, heartburn, nausea and vomiting.  Genitourinary: Negative for dysuria, frequency and urgency.  Skin: Negative for itching and rash.  Neurological: Negative for dizziness and headaches.  Endo/Heme/Allergies: Negative for polydipsia.  Psychiatric/Behavioral: Negative for depression.    Past Medical History:  Patient Active Problem List   Diagnosis Date Noted  . Normal labor 11/19/2018  . Normal labor and delivery 11/19/2018  . Postpartum care following vaginal delivery 11/19/2018  . Gestational diabetes 09/19/2018    Diet controlled Growth 36 weeks 7lbs 0oz   . Abnormal oral  glucose tolerance test 08/30/2018    1-hr of 144 at 28 weeks   . Antepartum multigravida of advanced maternal age 76/22/2020        . Supervision of high risk pregnancy, antepartum 03/30/2018    Clinic [facility] Prenatal Labs  Dating LMP = 7 week [place] Blood type:  O pos  Genetic Screen NIPS: Repeat XX Inheritest: negative  Antibody: negative  Anatomic [place] Normal Rubella: Immune  Varicella: Immune  GTT 144 3-hr 85, 187, 175, 124 RPR:   NR  Rhogam N/A HBsAg:   neg  TDaP vaccine Declined  Flu Shot: HIV:   neg  Baby Food                                GBS:   Contraception  Pap: 02/08/2018 NIL HPV negative  CBB   Pelvis tested: 7lbs 11oz  CS/VBAC N/A   Support Person Husband Karl         Past Surgical History:  Past Surgical History:  Procedure Laterality Date  . WISDOM TOOTH EXTRACTION      Gynecologic History:  Patient's last menstrual period was 02/03/2020. Contraception: vasectomy Last Pap: Results were:02/08/2018 NIL and HR HPV negative   Obstetric History: [id number]  Family History:  Family History  Problem Relation Age of Onset  . Diabetes Mother   . Heart disease Maternal Grandfather   . Cancer Paternal Grandmother   . Heart disease Paternal Grandfather   . Heart failure Father     Social History:  Social  History   Socioeconomic History  . Marital status: Married    Spouse name: Not on file  . Number of children: Not on file  . Years of education: Not on file  . Highest education level: Not on file  Occupational History  . Not on file  Tobacco Use  . Smoking status: Never Smoker  . Smokeless tobacco: Never Used  Vaping Use  . Vaping Use: Never used  Substance and Sexual Activity  . Alcohol use: No  . Drug use: No  . Sexual activity: Yes    Birth control/protection: Surgical    Comment: Husband vasectomy  Other Topics Concern  . Not on file  Social History Narrative  . Not on file   Social Determinants of Health   Financial Resource Strain:  Not on file  Food Insecurity: Not on file  Transportation Needs: Not on file  Physical Activity: Not on file  Stress: Not on file  Social Connections: Not on file  Intimate Partner Violence: Not on file    Allergies:  No Known Allergies  Medications: Prior to Admission medications   Medication Sig Start Date End Date Taking? Authorizing Provider  acetaminophen (TYLENOL) 325 MG tablet Take 2 tablets (650 mg total) by mouth every 4 (four) hours as needed for mild pain, moderate pain or headache (for pain scale < 4). 11/20/18  Yes Farrel Conners, CNM  ibuprofen (ADVIL) 600 MG tablet Take 1 tablet (600 mg total) by mouth every 6 (six) hours as needed for mild pain, moderate pain or cramping. 11/20/18  Yes Farrel Conners, CNM  Lactobacillus (PROBIOTIC ACIDOPHILUS PO) Take 1 tablet by mouth daily.   Yes [provider]  Prenatal Vit-Fe Fumarate-FA (MULTIVITAMIN-PRENATAL) 27-0.8 MG TABS tablet Take 1 tablet by mouth daily at 12 noon.   Yes [provider]  terconazole (TERAZOL 7) 0.4 % vaginal cream Place 1 applicator vaginally at bedtime. Patient not taking: Reported on 02/16/2020 12/27/18   Vena Austria, MD    Physical Exam Vitals: Blood pressure 110/66, height 5\' 7"  (1.702 m), weight 138 lb (62.6 kg), last menstrual period 02/03/2020, currently breastfeeding.  General: NAD HEENT: normocephalic, anicteric Thyroid: no enlargement, no palpable nodules Pulmonary: No increased work of breathing, CTAB Cardiovascular: RRR, distal pulses 2+ Breast: Breast symmetrical, no tenderness, no palpable nodules or masses, no skin or nipple retraction present, no nipple discharge.  No axillary or supraclavicular lymphadenopathy. Abdomen: NABS, soft, non-tender, non-distended.  Umbilicus without lesions.  No hepatomegaly, splenomegaly or masses palpable. No evidence of hernia  Genitourinary:  External: Normal external female genitalia.  Normal urethral meatus, normal  Bartholin's and Skene's glands.    Vagina: Normal vaginal mucosa, no evidence of prolapse.    Cervix: Grossly normal in appearance, no bleeding  Uterus: Non-enlarged, mobile, normal contour.  No CMT  Adnexa: ovaries non-enlarged, no adnexal masses  Rectal: deferred  Lymphatic: no evidence of inguinal lymphadenopathy Extremities: no edema, erythema, or tenderness Neurologic: Grossly intact Psychiatric: mood appropriate, affect full  Female chaperone present for pelvic and breast  portions of the physical exam   There is no immunization history on file for this patient.   Assessment: 39 y.o. 20 routine annual exam  Plan: Problem List Items Addressed This Visit   None   Visit Diagnoses    Encounter for gynecological examination without abnormal finding    -  Primary   Breast screening       Screening for malignant neoplasm of cervix       Relevant  Orders   Cytology - PAP   Atypical chest pain       Relevant Orders   Ambulatory referral to Cardiology   DG Chest 2 View      1) STI screening  was notoffered and therefore not obtained  2)  ASCCP guidelines and rational discussed.  Patient opts for every 3 years screening interval  3) Contraception - the patient is currently using  vasectomy.  She is happy with her current form of contraception and plans to continue  4) Routine healthcare maintenance including cholesterol, diabetes screening discussed managed by PCP  5) Atypical chest pain - some pleuritic component with worsening on inspiration.  Will obtain CXR and set up cardiology referral   6) Return in about 1 year (around 02/15/2021) for annual.   Vena Austria, MD, [facility], [facility] 02/16/2020, 11:13 AM

## 2020-02-19 LAB — CYTOLOGY - PAP
Adequacy: ABSENT
Comment: NEGATIVE
Diagnosis: NEGATIVE
High risk HPV: NEGATIVE

## 2020-03-21 ENCOUNTER — Other Ambulatory Visit: Payer: Self-pay

## 2020-03-21 ENCOUNTER — Ambulatory Visit (INDEPENDENT_AMBULATORY_CARE_PROVIDER_SITE_OTHER): Payer: 59 | Admitting: Cardiology

## 2020-03-21 ENCOUNTER — Encounter: Payer: Self-pay | Admitting: Cardiology

## 2020-03-21 VITALS — BP 110/80 | HR 90 | Ht 67.0 in | Wt 137.4 lb

## 2020-03-21 DIAGNOSIS — R072 Precordial pain: Secondary | ICD-10-CM

## 2020-03-21 DIAGNOSIS — Z8249 Family history of ischemic heart disease and other diseases of the circulatory system: Secondary | ICD-10-CM

## 2020-03-21 MED ORDER — METOPROLOL TARTRATE 100 MG PO TABS: 100.0000 mg | ORAL_TABLET | Freq: Once | ORAL | 0 refills | Status: DC

## 2020-03-21 MED ORDER — IVABRADINE HCL 5 MG PO TABS: ORAL_TABLET | ORAL | 0 refills | Status: AC

## 2020-03-21 NOTE — Progress Notes (Signed)
Cardiology Office Note:    Date:  03/21/2020   ID:  Bill C Paquette, DOB 1980/04/14, MRN [id number]  PCP:  Hortencia Pilar, MD  [facility] Cardiologist:  No primary care provider on file.  [place] [facility] Electrophysiologist:  None   Referring MD: Malachy Mood, MD   Chief Complaint  Patient presents with  . New Patient (Initial Visit)    Ref by Dr. Georgianne Fick for chest pain. Medications reviewed by the patient verbally. Patient c/o chest pain, fluttering in chest that comes and goes and difficulty taking a deep breath at times.    Chelsea Sherman is a 40 y.o. female who is being seen today for the evaluation of chest pain at the request of Malachy Mood, MD.   History of Present Illness:    Chelsea Sherman is a 40 y.o. female with no significant personal past medical history who presents due to chest pain over the past 8 months.  She states having abdominal discomfort which radiates to her chest.  Occasionally she has symptoms of chest pain which appears sharp radiating down her right arms.  Symptoms are not associated with exertion.  She denies smoking or any personal cardiac risk factors.  Has a family history of CAD in her father who had three-vessel CABG in his 14s.  His father also has aortic aneurysm and mitral valve prolapse underwent aortic root repair.  Past Medical History:  Diagnosis Date  . Gestational diabetes   . Pap smear abnormality of cervix 02/2014   ASCUS    Past Surgical History:  Procedure Laterality Date  . WISDOM TOOTH EXTRACTION      Current Medications: Current Meds  Medication Sig  . acetaminophen (TYLENOL) 325 MG tablet Take 2 tablets (650 mg total) by mouth every 4 (four) hours as needed for mild pain, moderate pain or headache (for pain scale < 4).  . ibuprofen (ADVIL) 600 MG tablet Take 1 tablet (600 mg total) by mouth every 6 (six) hours as needed for mild pain, moderate pain or cramping.  . ivabradine (CORLANOR) 5 MG TABS tablet  Take 1 tablet by mouth, Take 2 hours prior to your CT scan.  . Lactobacillus (PROBIOTIC ACIDOPHILUS PO) Take 1 tablet by mouth daily.  . metoprolol tartrate (LOPRESSOR) 100 MG tablet Take 1 tablet (100 mg total) by mouth once for 1 dose. Take 2 hours prior to your CT scan.  . Prenatal Vit-Fe Fumarate-FA (MULTIVITAMIN-PRENATAL) 27-0.8 MG TABS tablet Take 1 tablet by mouth daily at 12 noon.     Allergies:   Latex   Social History   Socioeconomic History  . Marital status: Married    Spouse name: Not on file  . Number of children: Not on file  . Years of education: Not on file  . Highest education level: Not on file  Occupational History  . Not on file  Tobacco Use  . Smoking status: Never Smoker  . Smokeless tobacco: Never Used  Vaping Use  . Vaping Use: Never used  Substance and Sexual Activity  . Alcohol use: No  . Drug use: No  . Sexual activity: Yes    Birth control/protection: Surgical    Comment: Husband vasectomy  Other Topics Concern  . Not on file  Social History Narrative  . Not on file   Social Determinants of Health   Financial Resource Strain: Not on file  Food Insecurity: Not on file  Transportation Needs: Not on file  Physical Activity: Not on file  Stress:  Not on file  Social Connections: Not on file     Family History: The patient's family history includes Cancer in her paternal grandmother; Diabetes in her mother; Heart disease in her maternal grandfather and paternal grandfather; Heart failure in her father.  ROS:   Please see the history of present illness.     All other systems reviewed and are negative.  EKGs/Labs/Other Studies Reviewed:    The following studies were reviewed today:   EKG:  EKG is  ordered today.  The ekg ordered today demonstrates normal sinus rhythm, normal ECG.  Recent Labs: No results found for requested labs within last 8760 hours.  Recent Lipid Panel No results found for: CHOL, TRIG, HDL, CHOLHDL, VLDL, LDLCALC,  LDLDIRECT   Risk Assessment/Calculations:      Physical Exam:    VS:  BP 110/80 (BP Location: Right Arm, Patient Position: Sitting, Cuff Size: Normal)   Pulse 90   Ht _0  (1.702 m)   Wt 137 lb 6 oz (62.3 kg)   SpO2 98%   BMI 21.52 kg/m     Wt Readings from Last 3 Encounters:  03/21/20 137 lb 6 oz (62.3 kg)  02/16/20 138 lb (62.6 kg)  12/27/18 143 lb (64.9 kg)     GEN:  Well nourished, well developed in no acute distress HEENT: Normal NECK: No JVD; No carotid bruits LYMPHATICS: No lymphadenopathy CARDIAC: RRR, no murmurs, rubs, gallops RESPIRATORY:  Clear to auscultation without rales, wheezing or rhonchi  ABDOMEN: Soft, non-tender, non-distended MUSCULOSKELETAL:  No edema; No deformity  SKIN: Warm and dry NEUROLOGIC:  Alert and oriented x 3 PSYCHIATRIC:  Normal affect   ASSESSMENT:    1. Precordial pain   2. Family hx of aortic aneurysm    PLAN:    In order of problems listed above:  1. Patient with chest pain, family history of CAD, aortic aneurysm.  Get coronary CTA to evaluate presence of CAD, also evaluate aorta.  Get echocardiogram 2. Family history of aortic aneurysm, mitral valve prolapse.  Echo and coronary CTA as above.  Follow-up after testing.       Medication Adjustments/Labs and Tests Ordered: Current medicines are reviewed at length with the patient today.  Concerns regarding medicines are outlined above.  Orders Placed This Encounter  Procedures  . CT CORONARY MORPH W/CTA COR W/SCORE W/CA W/CM &/OR WO/CM  . CT CORONARY FRACTIONAL FLOW RESERVE DATA PREP  . CT CORONARY FRACTIONAL FLOW RESERVE FLUID ANALYSIS  . EKG 12-Lead  . ECHOCARDIOGRAM COMPLETE   Meds ordered this encounter  Medications  . metoprolol tartrate (LOPRESSOR) 100 MG tablet    Sig: Take 1 tablet (100 mg total) by mouth once for 1 dose. Take 2 hours prior to your CT scan.    Dispense:  1 tablet    Refill:  0  . ivabradine (CORLANOR) 5 MG TABS tablet    Sig: Take 1  tablet by mouth, Take 2 hours prior to your CT scan.    Dispense:  1 tablet    Refill:  0    Patient Instructions  Medication Instructions:  Your physician recommends that you continue on your current medications as directed. Please refer to the Current Medication list given to you today.   *If you need a refill on your cardiac medications before your next appointment, please call your pharmacy*   Lab Work: None Ordered If you have labs (blood work) drawn today and your tests are completely normal, you will receive your results  only by: Marland Kitchen MyChart Message (if you have MyChart) OR . A paper copy in the mail If you have any lab test that is abnormal or we need to change your treatment, we will call you to review the results.   Testing/Procedures:  1.  Your physician has requested that you have an echocardiogram. Echocardiography is a painless test that uses sound waves to create images of your heart. It provides your doctor with information about the size and shape of your heart and how well your heart's chambers and valves are working. This procedure takes approximately one hour. There are no restrictions for this procedure.  2.  Your physician has requested that you have cardiac CT. Cardiac computed tomography (CT) is a painless test that uses an x-ray machine to take clear, detailed pictures of your heart.   Your cardiac CT will be scheduled at:  [facility] [address] [place], [place] [address] [phone]  Please arrive 15 mins early for check-in and test prep.   Please follow these instructions carefully (unless otherwise directed):    On the Night Before the Test: . Be sure to Drink plenty of water. . Do not consume any caffeinated/decaffeinated beverages or chocolate 12 hours prior to your test. . Do not take any antihistamines 12 hours prior to your test.   On the Day of the Test: . Drink plenty of water. Do not  drink any water within one hour of the test. . Do not eat any food 4 hours prior to the test. . You may take your regular medications prior to the test.  . Take metoprolol (Lopressor) two hours prior to test. . Take Corlanor (Ivabradine) two hours prior to test. . FEMALES- please wear underwire-free bra if available   After the Test: . Drink plenty of water. . After receiving IV contrast, you may experience a mild flushed feeling. This is normal. . On occasion, you may experience a mild rash up to 24 hours after the test. This is not dangerous. If this occurs, you can take Benadryl 25 mg and increase your fluid intake. . If you experience trouble breathing, this can be serious. If it is severe call 911 IMMEDIATELY. If it is mild, please call our office.   Once we have confirmed authorization from your insurance company, we will call you to set up a date and time for your test. Based on how quickly your insurance processes prior authorizations requests, please allow up to 4 weeks to be contacted for scheduling your Cardiac CT appointment. Be advised that routine Cardiac CT appointments could be scheduled as many as 8 weeks after your provider has ordered it.  For non-scheduling related questions, please contact the cardiac imaging nurse navigator should you have any questions/concerns: Marchia Bond, Cardiac Imaging Nurse Navigator Burley Saver, Interim Cardiac Imaging Nurse [place] [facility] Direct Office Dial: [phone]   For scheduling needs, including cancellations and rescheduling, please call [place], [phone].    Follow-Up: At [facility], you and your health needs are our priority.  As part of our continuing mission to provide you with exceptional heart care, we have created designated Provider Care Teams.  These Care Teams include your primary Cardiologist (physician) and Advanced Practice Providers (APPs -  Physician Assistants and Nurse  Practitioners) who all work together to provide you with the care you need, when you need it.  We recommend signing up for the patient portal called "MyChart".  Sign up information is provided on this After Visit Summary.  MyChart is used to connect with patients for Virtual Visits (Telemedicine).  Patients are able to view lab/test results, encounter notes, upcoming appointments, etc.  Non-urgent messages can be sent to your provider as well.   To learn more about what you can do with MyChart, go to [email or web address].    Your next appointment:   5-6 week(s)  The format for your next appointment:   In Person  Provider:   Kate Sable, MD   Other Instructions      Signed, Kate Sable, MD  03/21/2020 1:37 PM    [place]

## 2020-03-21 NOTE — Patient Instructions (Signed)
Medication Instructions:  Your physician recommends that you continue on your current medications as directed. Please refer to the Current Medication list given to you today.   *If you need a refill on your cardiac medications before your next appointment, please call your pharmacy*   Lab Work: None Ordered If you have labs (blood work) drawn today and your tests are completely normal, you will receive your results only by: Marland Kitchen MyChart Message (if you have MyChart) OR . A paper copy in the mail If you have any lab test that is abnormal or we need to change your treatment, we will call you to review the results.   Testing/Procedures:  1.  Your physician has requested that you have an echocardiogram. Echocardiography is a painless test that uses sound waves to create images of your heart. It provides your doctor with information about the size and shape of your heart and how well your heart's chambers and valves are working. This procedure takes approximately one hour. There are no restrictions for this procedure.  2.  Your physician has requested that you have cardiac CT. Cardiac computed tomography (CT) is a painless test that uses an x-ray machine to take clear, detailed pictures of your heart.   Your cardiac CT will be scheduled at:  [facility] [address] [place], [place] [address] [phone]  Please arrive 15 mins early for check-in and test prep.   Please follow these instructions carefully (unless otherwise directed):    On the Night Before the Test: . Be sure to Drink plenty of water. . Do not consume any caffeinated/decaffeinated beverages or chocolate 12 hours prior to your test. . Do not take any antihistamines 12 hours prior to your test.   On the Day of the Test: . Drink plenty of water. Do not drink any water within one hour of the test. . Do not eat any food 4 hours prior to the test. . You may take your  regular medications prior to the test.  . Take metoprolol (Lopressor) two hours prior to test. . Take Corlanor (Ivabradine) two hours prior to test. . FEMALES- please wear underwire-free bra if available   After the Test: . Drink plenty of water. . After receiving IV contrast, you may experience a mild flushed feeling. This is normal. . On occasion, you may experience a mild rash up to 24 hours after the test. This is not dangerous. If this occurs, you can take Benadryl 25 mg and increase your fluid intake. . If you experience trouble breathing, this can be serious. If it is severe call 911 IMMEDIATELY. If it is mild, please call our office.   Once we have confirmed authorization from your insurance company, we will call you to set up a date and time for your test. Based on how quickly your insurance processes prior authorizations requests, please allow up to 4 weeks to be contacted for scheduling your Cardiac CT appointment. Be advised that routine Cardiac CT appointments could be scheduled as many as 8 weeks after your provider has ordered it.  For non-scheduling related questions, please contact the cardiac imaging nurse navigator should you have any questions/concerns: Marchia Bond, Cardiac Imaging Nurse Navigator Burley Saver, Interim Cardiac Imaging Nurse [place] [facility] Direct Office Dial: [phone]   For scheduling needs, including cancellations and rescheduling, please call [place], [phone].    Follow-Up: At [facility], you and your health needs are our priority.  As part of our continuing mission to provide you with exceptional heart care, we have created designated Provider Care Teams.  These Care Teams include your primary Cardiologist (physician) and Advanced Practice Providers (APPs -  Physician Assistants and Nurse Practitioners) who all work together to provide you with the care you need, when you need it.  We recommend signing up  for the patient portal called "MyChart".  Sign up information is provided on this After Visit Summary.  MyChart is used to connect with patients for Virtual Visits (Telemedicine).  Patients are able to view lab/test results, encounter notes, upcoming appointments, etc.  Non-urgent messages can be sent to your provider as well.   To learn more about what you can do with MyChart, go to [email or web address].    Your next appointment:   5-6 week(s)  The format for your next appointment:   In Person  Provider:   Kate Sable, MD   Other Instructions

## 2020-04-05 ENCOUNTER — Ambulatory Visit (INDEPENDENT_AMBULATORY_CARE_PROVIDER_SITE_OTHER): Payer: 59

## 2020-04-05 ENCOUNTER — Other Ambulatory Visit: Payer: Self-pay

## 2020-04-05 DIAGNOSIS — R072 Precordial pain: Secondary | ICD-10-CM | POA: Diagnosis not present

## 2020-04-05 LAB — ECHOCARDIOGRAM COMPLETE
AR max vel: 2.45 cm2
AV Area VTI: 2.42 cm2
AV Area mean vel: 2.44 cm2
AV Mean grad: 3 mmHg
AV Peak grad: 5.5 mmHg
Ao pk vel: 1.17 m/s
Area-P 1/2: 4.74 cm2
Calc EF: 57.7 %
S' Lateral: 2.8 cm
Single Plane A2C EF: 58 %
Single Plane A4C EF: 58 %

## 2020-04-15 ENCOUNTER — Encounter (HOSPITAL_COMMUNITY): Payer: Self-pay

## 2020-04-16 ENCOUNTER — Telehealth (HOSPITAL_COMMUNITY): Payer: Self-pay | Admitting: Emergency Medicine

## 2020-04-16 NOTE — Telephone Encounter (Signed)
Reaching out to patient to offer assistance regarding upcoming cardiac imaging study; pt verbalizes understanding of appt date/time, parking situation and where to check in, pre-test NPO status and medications ordered, and verified current allergies; name and call back number provided for further questions should they arise Rockwell [place] RN Navigator Cardiac Imaging [facility] [phone] office [phone] cell  Pt verbalized understanding to pump and dump breast milk from the time she takes CCTA pre meds (metop and ivab) to Friday @ 9am.   [place] Dec

## 2020-04-18 ENCOUNTER — Other Ambulatory Visit: Payer: Self-pay

## 2020-04-18 ENCOUNTER — Ambulatory Visit
Admission: RE | Admit: 2020-04-18 | Discharge: 2020-04-18 | Disposition: A | Discharge status: Home / Self Care | Payer: 59 | Source: Ambulatory Visit | Attending: Cardiology | Admitting: Cardiology

## 2020-04-18 DIAGNOSIS — R072 Precordial pain: Secondary | ICD-10-CM

## 2020-04-18 MED ORDER — METOPROLOL TARTRATE 5 MG/5ML IV SOLN
5.0000 mg | Freq: Once | INTRAVENOUS | Status: AC
  Administered 2020-04-18: 5 mg via INTRAVENOUS

## 2020-04-18 MED ORDER — IOHEXOL 350 MG/ML SOLN
75.0000 mL | Freq: Once | INTRAVENOUS | Status: AC | PRN
  Administered 2020-04-18: 75 mL via INTRAVENOUS

## 2020-04-18 MED ORDER — NITROGLYCERIN 0.4 MG SL SUBL: 0.8000 mg | SUBLINGUAL_TABLET | Freq: Once | SUBLINGUAL | Status: DC

## 2020-04-18 MED ORDER — METOPROLOL TARTRATE 5 MG/5ML IV SOLN: 10.0000 mg | Freq: Once | INTRAVENOUS | Status: DC

## 2020-04-18 MED ORDER — NITROGLYCERIN 0.4 MG SL SUBL
0.4000 mg | SUBLINGUAL_TABLET | Freq: Once | SUBLINGUAL | Status: AC
  Administered 2020-04-18: 0.4 mg via SUBLINGUAL

## 2020-04-18 NOTE — Progress Notes (Signed)
Patient tolerated procedure well. Ambulate w/o difficulty. Sitting in chair drinking water provided. Encouraged to drink extra water today and reasoning explained. Verbalized understanding. All questions answered. ABC intact. No further needs. Discharge from procedure area w/o issues.  

## 2020-04-25 ENCOUNTER — Other Ambulatory Visit: Payer: Self-pay

## 2020-04-25 ENCOUNTER — Encounter: Payer: Self-pay | Admitting: Cardiology

## 2020-04-25 ENCOUNTER — Ambulatory Visit (INDEPENDENT_AMBULATORY_CARE_PROVIDER_SITE_OTHER): Payer: 59 | Admitting: Cardiology

## 2020-04-25 VITALS — BP 104/62 | HR 83 | Ht 67.0 in | Wt 137.0 lb

## 2020-04-25 DIAGNOSIS — Z8249 Family history of ischemic heart disease and other diseases of the circulatory system: Secondary | ICD-10-CM | POA: Diagnosis not present

## 2020-04-25 DIAGNOSIS — R072 Precordial pain: Secondary | ICD-10-CM

## 2020-04-25 NOTE — Progress Notes (Signed)
Cardiology Office Note:    Date:  04/25/2020   ID:  Chelsea Sherman, DOB [birth year], MRN [id number]  PCP:  Rolm Gala, MD  [facility] Cardiologist:  No primary care provider on file.  [facility] Electrophysiologist:  None   Referring MD: Rolm Gala, MD   Chief Complaint  Patient presents with  . Follow-up    After testing--echo and CTA     History of Present Illness:    Chelsea Sherman is a 40 y.o. female with no significant personal past medical history who presents for follow-up.  Last seen due to chest pain and abdominal discomfort radiating to her chest.  Also has family history of CAD and aortic aneurysm.  Echo and coronary CTA was ordered to evaluate symptoms.  She now presents for follow-up after testing.  Has no new concerns at this time.  Past Medical History:  Diagnosis Date  . Gestational diabetes   . Pap smear abnormality of cervix 02/2014   ASCUS    Past Surgical History:  Procedure Laterality Date  . WISDOM TOOTH EXTRACTION      Current Medications: Current Meds  Medication Sig  . acetaminophen (TYLENOL) 325 MG tablet Take 2 tablets (650 mg total) by mouth every 4 (four) hours as needed for mild pain, moderate pain or headache (for pain scale < 4).  . ibuprofen (ADVIL) 600 MG tablet Take 1 tablet (600 mg total) by mouth every 6 (six) hours as needed for mild pain, moderate pain or cramping.  . ivabradine (CORLANOR) 5 MG TABS tablet Take 1 tablet by mouth, Take 2 hours prior to your CT scan.  . Lactobacillus (PROBIOTIC ACIDOPHILUS PO) Take 1 tablet by mouth daily.  . Prenatal Vit-Fe Fumarate-FA (MULTIVITAMIN-PRENATAL) 27-0.8 MG TABS tablet Take 1 tablet by mouth daily at 12 noon.     Allergies:   Latex   Social History   Socioeconomic History  . Marital status: Married    Spouse name: Not on file  . Number of children: Not on file  . Years of education: Not on file  . Highest education level: Not on file  Occupational History  .  Not on file  Tobacco Use  . Smoking status: Never Smoker  . Smokeless tobacco: Never Used  Vaping Use  . Vaping Use: Never used  Substance and Sexual Activity  . Alcohol use: No  . Drug use: No  . Sexual activity: Yes    Birth control/protection: Surgical    Comment: Husband vasectomy  Other Topics Concern  . Not on file  Social History Narrative  . Not on file   Social Determinants of Health   Financial Resource Strain: Not on file  Food Insecurity: Not on file  Transportation Needs: Not on file  Physical Activity: Not on file  Stress: Not on file  Social Connections: Not on file     Family History: The patient's family history includes Cancer in her paternal grandmother; Diabetes in her mother; Heart disease in her maternal grandfather and paternal grandfather; Heart failure in her father.  ROS:   Please see the history of present illness.     All other systems reviewed and are negative.  EKGs/Labs/Other Studies Reviewed:    The following studies were reviewed today:   EKG:  EKG not  ordered today.   Recent Labs: No results found for requested labs within last 8760 hours.  Recent Lipid Panel No results found for: CHOL, TRIG, HDL, CHOLHDL, VLDL, LDLCALC, LDLDIRECT   Risk  Assessment/Calculations:      Physical Exam:    VS:  BP 104/62   Pulse 83   Ht 5\' 7"  (1.702 m)   Wt 137 lb (62.1 kg)   BMI 21.46 kg/m     Wt Readings from Last 3 Encounters:  04/25/20 137 lb (62.1 kg)  03/21/20 137 lb 6 oz (62.3 kg)  02/16/20 138 lb (62.6 kg)     GEN:  Well nourished, well developed in no acute distress HEENT: Normal NECK: No JVD; No carotid bruits LYMPHATICS: No lymphadenopathy CARDIAC: RRR, no murmurs, rubs, gallops RESPIRATORY:  Clear to auscultation without rales, wheezing or rhonchi  ABDOMEN: Soft, non-tender, non-distended MUSCULOSKELETAL:  No edema; No deformity  SKIN: Warm and dry NEUROLOGIC:  Alert and oriented x 3 PSYCHIATRIC:  Normal affect    ASSESSMENT:    1. Precordial pain   2. Family hx of aortic aneurysm    PLAN:    In order of problems listed above:  1. Patient with chest pain, family history of CAD, aortic aneurysm.  Echocardiogram showed normal systolic and diastolic function, EF 60 to 65%.  Coronary CTA showed a calcium score of 0, no evidence of CAD, normal aortic dilation. Patient made aware of results and reassured 2. Family history of aortic aneurysm, mitral valve prolapse.  Echo and CT showed normal aorta dimensions. Patient made aware of results and reassured.  Follow-up as needed  Medication Adjustments/Labs and Tests Ordered: Current medicines are reviewed at length with the patient today.  Concerns regarding medicines are outlined above.  No orders of the defined types were placed in this encounter.  No orders of the defined types were placed in this encounter.   Patient Instructions  Medication Instructions:  Your physician recommends that you continue on your current medications as directed. Please refer to the Current Medication list given to you today.  *If you need a refill on your cardiac medications before your next appointment, please call your pharmacy*   Lab Work: None ordered If you have labs (blood work) drawn today and your tests are completely normal, you will receive your results only by: 14/10/21 MyChart Message (if you have MyChart) OR . A paper copy in the mail If you have any lab test that is abnormal or we need to change your treatment, we will call you to review the results.   Testing/Procedures: None ordered   Follow-Up: At [facility], you and your health needs are our priority.  As part of our continuing mission to provide you with exceptional heart care, we have created designated Provider Care Teams.  These Care Teams include your primary Cardiologist (physician) and Advanced Practice Providers (APPs -  Physician Assistants and Nurse Practitioners) who all work together  to provide you with the care you need, when you need it.  We recommend signing up for the patient portal called "MyChart".  Sign up information is provided on this After Visit Summary.  MyChart is used to connect with patients for Virtual Visits (Telemedicine).  Patients are able to view lab/test results, encounter notes, upcoming appointments, etc.  Non-urgent messages can be sent to your provider as well.   To learn more about what you can do with MyChart, go to [facility].    Your next appointment:   Follow up as needed   The format for your next appointment:   In Person  Provider:   [email or web address], MD   Other Instructions      Signed, Debbe Odea, MD  04/25/2020 12:58 PM    [facility]

## 2020-04-25 NOTE — Patient Instructions (Signed)

## 2021-06-09 ENCOUNTER — Other Ambulatory Visit: Payer: Self-pay | Admitting: Family Medicine

## 2021-06-09 DIAGNOSIS — R55 Syncope and collapse: Secondary | ICD-10-CM

## 2021-06-09 DIAGNOSIS — R42 Dizziness and giddiness: Secondary | ICD-10-CM

## 2021-06-10 ENCOUNTER — Other Ambulatory Visit (HOSPITAL_COMMUNITY): Payer: Self-pay | Admitting: Family Medicine

## 2021-06-10 DIAGNOSIS — R55 Syncope and collapse: Secondary | ICD-10-CM

## 2021-06-10 DIAGNOSIS — R42 Dizziness and giddiness: Secondary | ICD-10-CM

## 2021-07-09 ENCOUNTER — Ambulatory Visit (HOSPITAL_COMMUNITY): Payer: 59

## 2021-07-09 ENCOUNTER — Encounter (HOSPITAL_COMMUNITY): Payer: Self-pay

## 2021-07-21 ENCOUNTER — Ambulatory Visit (HOSPITAL_COMMUNITY)
Admission: RE | Admit: 2021-07-21 | Discharge: 2021-07-21 | Disposition: A | Discharge status: Home / Self Care | Payer: BC Managed Care – PPO | Source: Ambulatory Visit | Attending: Family Medicine | Admitting: Family Medicine

## 2021-07-21 DIAGNOSIS — R42 Dizziness and giddiness: Secondary | ICD-10-CM | POA: Diagnosis not present

## 2021-07-21 DIAGNOSIS — R55 Syncope and collapse: Secondary | ICD-10-CM | POA: Diagnosis present

## 2021-07-21 MED ORDER — GADOBUTROL 1 MMOL/ML IV SOLN
6.5000 mL | Freq: Once | INTRAVENOUS | Status: AC | PRN
  Administered 2021-07-21: 6.5 mL via INTRAVENOUS

## 2022-07-06 ENCOUNTER — Other Ambulatory Visit: Payer: Self-pay | Admitting: Obstetrics and Gynecology

## 2022-07-06 DIAGNOSIS — Z1231 Encounter for screening mammogram for malignant neoplasm of breast: Secondary | ICD-10-CM

## 2024-01-31 IMAGING — MR MR HEAD WO/W CM
15 of 16 series · 37 of 48 positions shown · IV contrast (gadavist)
Comparison: None Available.

CLINICAL DATA: Dizziness, syncope and collapse.

EXAM:
MRI HEAD WITHOUT AND WITH CONTRAST
TECHNIQUE: Multiplanar, multiecho pulse sequences of the brain and surrounding
structures were obtained without and with intravenous contrast.
CONTRAST:  6.5mL GADAVIST GADOBUTROL 1 MMOL/ML IV SOLN

[Series 3: FLAIR · sagittal · 3.0mm · 0.47mm/px · 1 of 30 slices shown (1 of 2)]
[im 1/30]
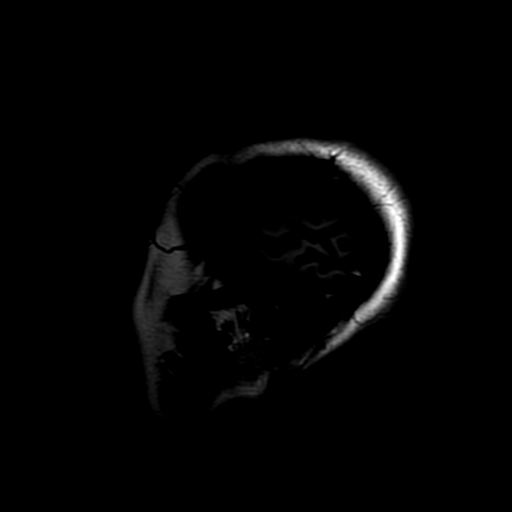

[Series 4: T2 · axial · 5.0mm · 0.47mm/px · 1 of 27 slices shown]
[im 1/27]
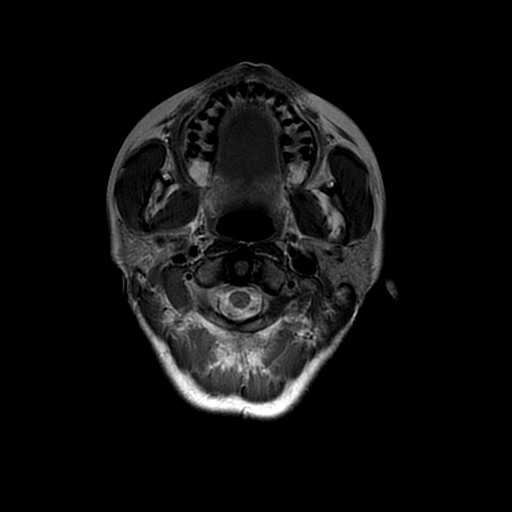

[Series 5: ax dti · axial · 3.0mm · 0.94mm/px · z∈[+38,+188]mm · 12 of 1326 slices shown]
[im 1/1326]
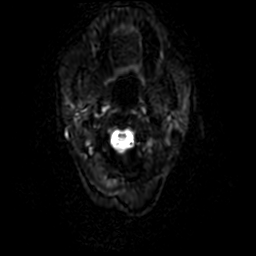
[im 102/1326]
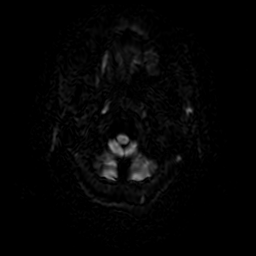
[im 204/1326]
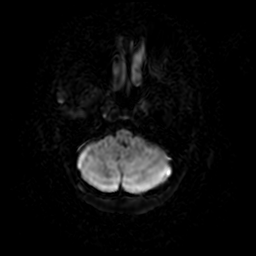
[im 306/1326]
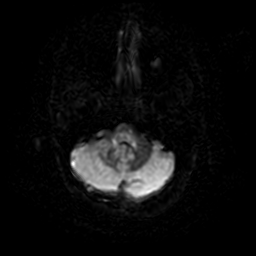
[im 408/1326]
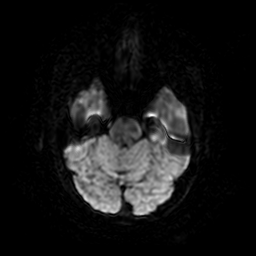
[im 510/1326]
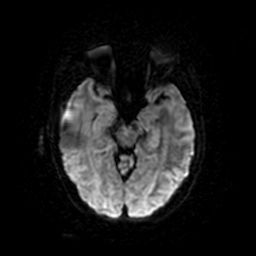
[im 612/1326]
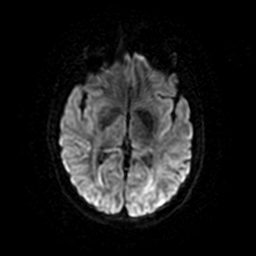
[im 714/1326]
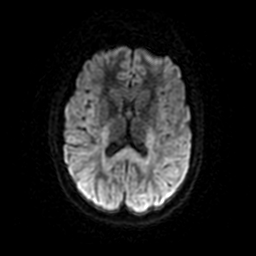
[im 816/1326]
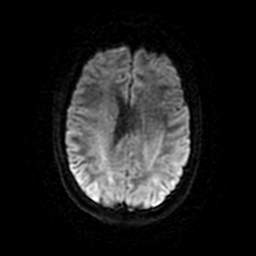
[im 918/1326]
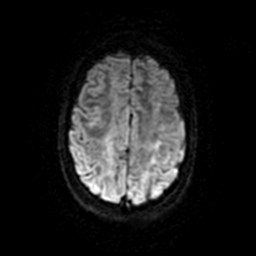
[im 1122/1326]
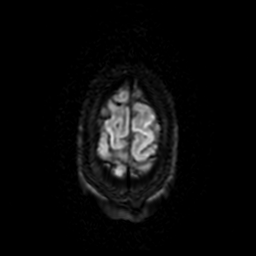
[im 1326/1326]
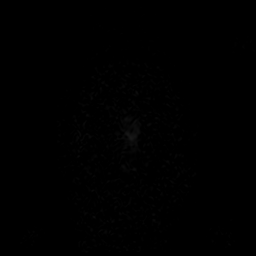

[Series 7: (person_name) · axial · 3.0mm · 0.47mm/px · 1 of 100 slices shown]
[im 1/100]
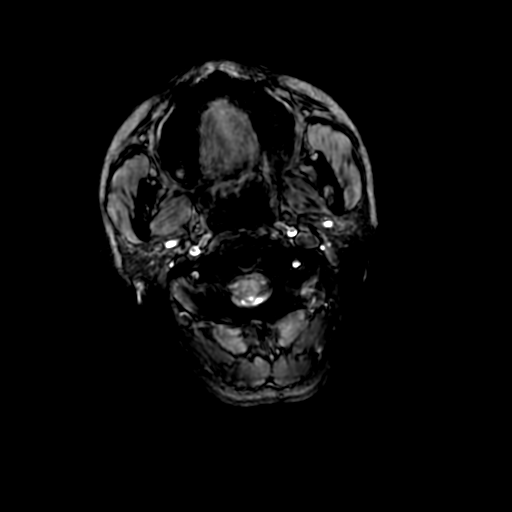

[Series 8: ax 3(person_name) · axial · 1.0mm · 1.02mm/px · 1 of 162 slices shown (1 of 2)]
[im 1/162]
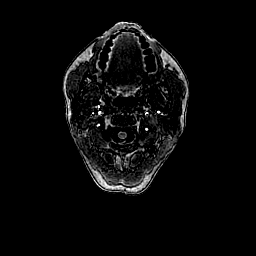

[Series 9: T2 post-contrast · coronal · 3.0mm · 0.39mm/px · 1 of 54 slices shown]
[im 1/54]
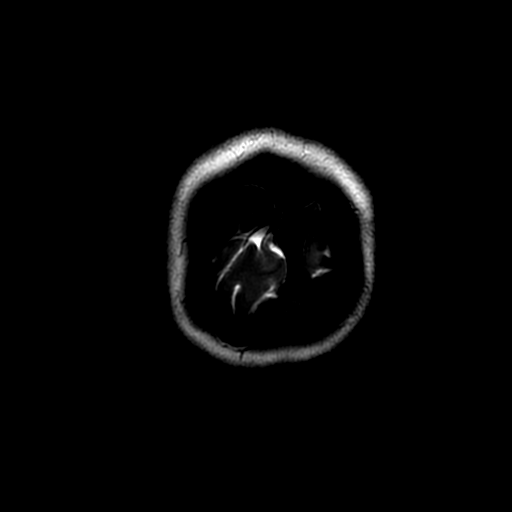

[Series 10: ax 3(person_name) · axial · 1.0mm · 1.02mm/px · 1 of 162 slices shown (2 of 2)]
[im 1/162]
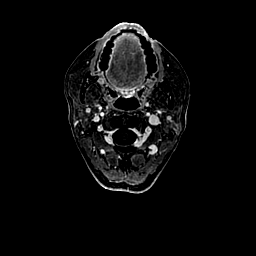

[Series 11: T1 · coronal · 5.0mm · 0.43mm/px · 1 of 29 slices shown]
[im 1/29]
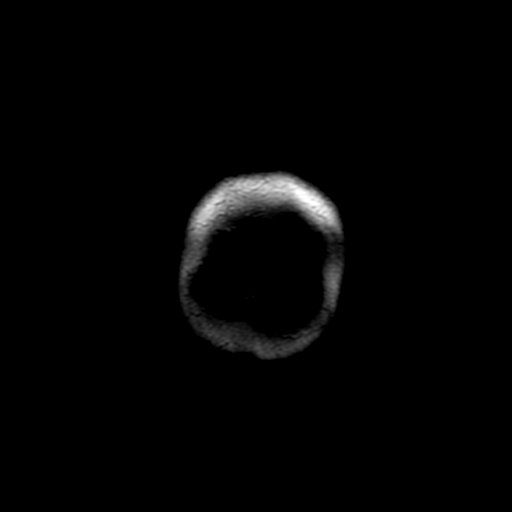

[Series 13: FLAIR · sagittal · 3.0mm · 0.47mm/px · 1 of 32 slices shown (2 of 2)]
[im 1/32]
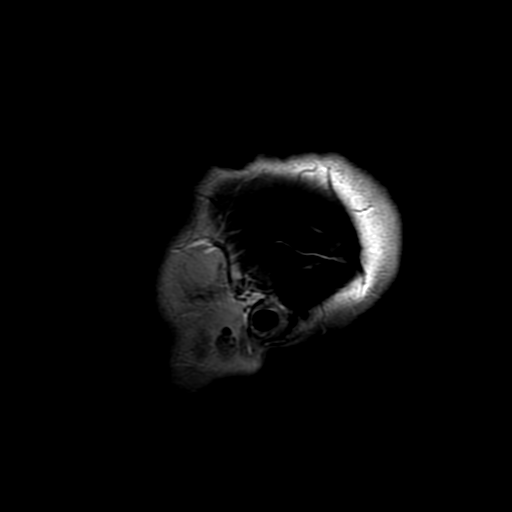

[Series 510: orig: ax dti · axial · 3.0mm · 0.94mm/px · z∈[+38,+188]mm · 8 of 1326 slices shown]
[im 1/1326]
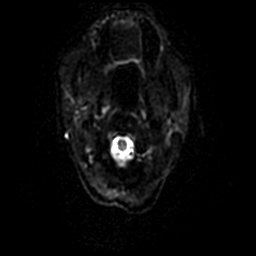
[im 204/1326]
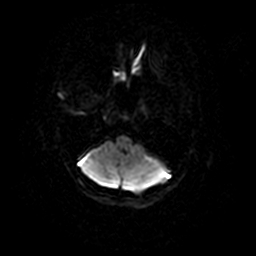
[im 408/1326]
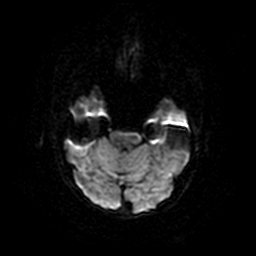
[im 612/1326]
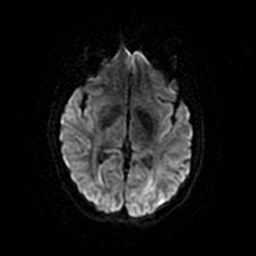
[im 714/1326]
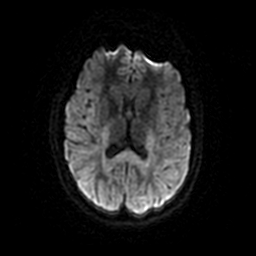
[im 918/1326]
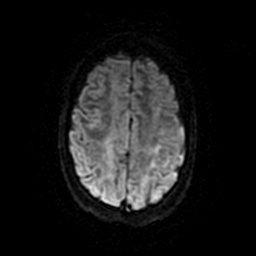
[im 1122/1326]
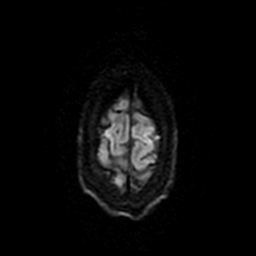
[im 1326/1326]
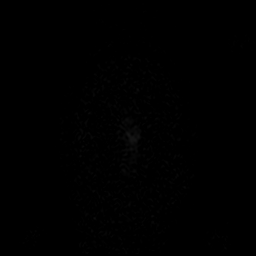

[Series 550: trace · axial · 3.0mm · 0.94mm/px · 1 of 51 slices shown]
[im 1/51]
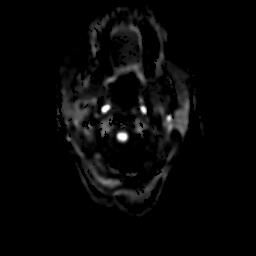

[Series 551: fa(no-q) · axial · 3.0mm · 0.94mm/px · 1 of 44 slices shown]
[im 1/44]
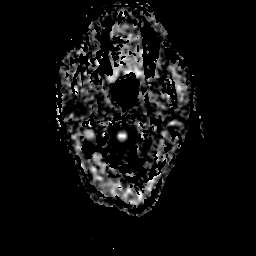

[Series 552: avdc (10^-6 mm²/s)(no-q) · axial · 3.0mm · 0.94mm/px · 1 of 51 slices shown]
[im 1/51]
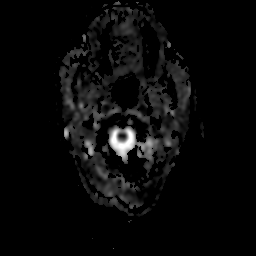

[Series 600: multiplanar reconstruction (mpr) · axial · 1.0mm · 0.50mm/px · z∈[-41,+214]mm · 3 of 256 slices shown (1 of 2)]
[im 1/256]
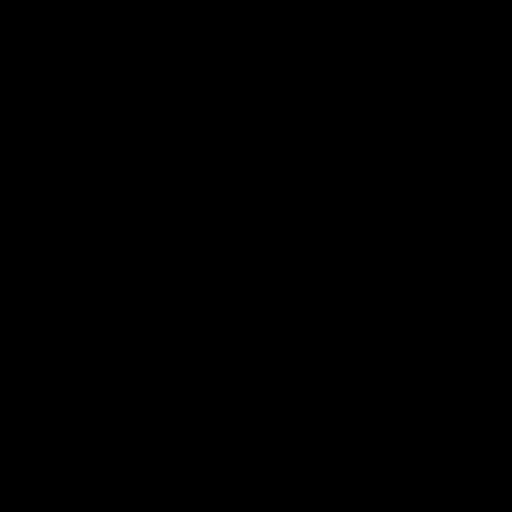
[im 128/256]
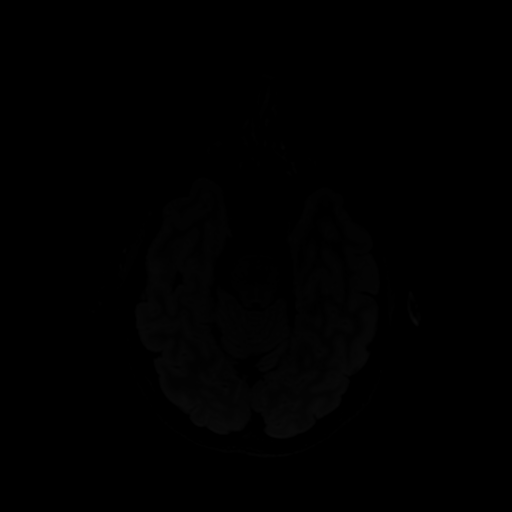
[im 256/256]
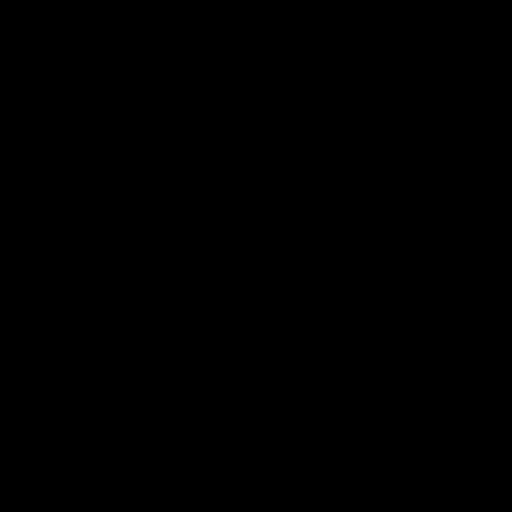

[Series 601: multiplanar reconstruction (mpr) · coronal · 1.0mm · 0.50mm/px · 3 of 244 slices shown (2 of 2)]
[im 1/244]
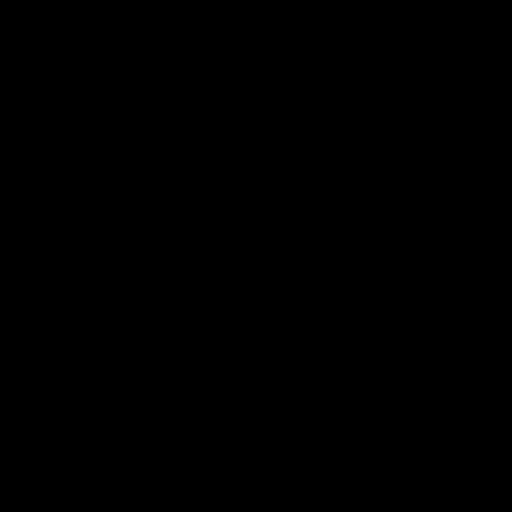
[im 122/244]
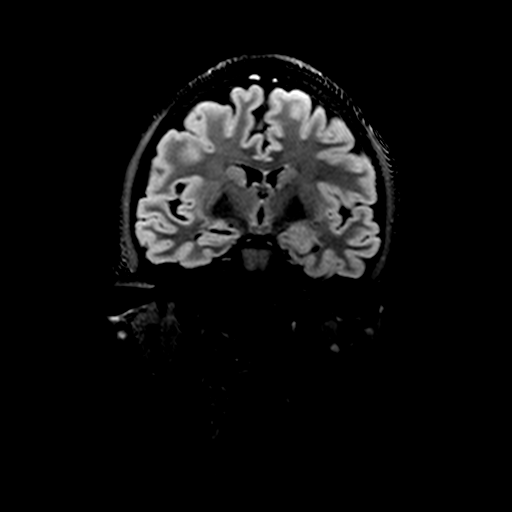
[im 244/244]
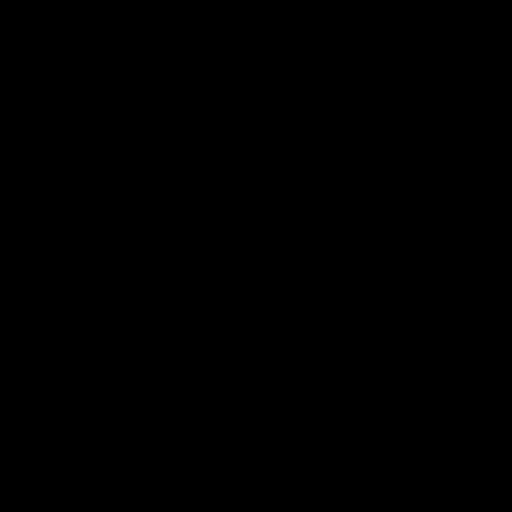

[37 of 48 positions shown; findings below may reference images not displayed]

FINDINGS: Brain: No acute infarct, mass effect or extra-axial collection. No
acute or chronic hemorrhage. Normal white matter signal, parenchymal
volume and CSF spaces. The midline structures are normal. There is
no abnormal contrast enhancement.

Vascular: Major flow voids are preserved.

Skull and upper cervical spine: Normal calvarium and skull base.
Visualized upper cervical spine and soft tissues are normal.

Sinuses/Orbits:No paranasal sinus fluid levels or advanced mucosal
thickening. No mastoid or middle ear effusion. Normal orbits.
IMPRESSION: Normal brain MRI.
